# Patient Record
Sex: Female | Born: 1995 | Race: White | Hispanic: No | Marital: Single | Smoking: Never smoker
Health system: Southern US, Community
[De-identification: ages and names within clinical notes are randomized; demographics above are authoritative.]

## PROBLEM LIST (undated history)

## (undated) DIAGNOSIS — K219 Gastro-esophageal reflux disease without esophagitis: Secondary | ICD-10-CM

## (undated) DIAGNOSIS — K5900 Constipation, unspecified: Secondary | ICD-10-CM

## (undated) DIAGNOSIS — G8389 Other specified paralytic syndromes: Secondary | ICD-10-CM

## (undated) DIAGNOSIS — Q8789 Other specified congenital malformation syndromes, not elsewhere classified: Secondary | ICD-10-CM

## (undated) DIAGNOSIS — T7840XA Allergy, unspecified, initial encounter: Secondary | ICD-10-CM

## (undated) HISTORY — DX: Other specified congenital malformation syndromes, not elsewhere classified: Q87.89

## (undated) HISTORY — DX: Allergy, unspecified, initial encounter: T78.40XA

## (undated) HISTORY — DX: Gastro-esophageal reflux disease without esophagitis: K21.9

## (undated) HISTORY — DX: Constipation, unspecified: K59.00

---
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
## 2016-07-26 ENCOUNTER — Ambulatory Visit
Admission: RE | Admit: 2016-07-26 | Discharge: 2016-07-26 | Disposition: A | Discharge status: Home / Self Care | Payer: BC Managed Care – PPO | Source: Ambulatory Visit | Attending: Pediatrics | Admitting: Pediatrics

## 2016-07-26 ENCOUNTER — Other Ambulatory Visit: Payer: Self-pay | Admitting: Pediatrics

## 2016-07-26 ENCOUNTER — Ambulatory Visit: Payer: BC Managed Care – PPO | Attending: Pediatrics | Admitting: Occupational Therapy

## 2016-07-26 ENCOUNTER — Ambulatory Visit
Admission: RE | Admit: 2016-07-26 | Discharge: 2016-07-26 | Disposition: A | Discharge status: Home / Self Care | Payer: BC Managed Care – PPO | Attending: Pediatrics | Admitting: Pediatrics

## 2016-07-26 ENCOUNTER — Encounter: Payer: Self-pay | Admitting: Occupational Therapy

## 2016-07-26 DIAGNOSIS — M25572 Pain in left ankle and joints of left foot: Secondary | ICD-10-CM | POA: Insufficient documentation

## 2016-07-26 DIAGNOSIS — Z9889 Other specified postprocedural states: Secondary | ICD-10-CM | POA: Diagnosis not present

## 2016-07-26 DIAGNOSIS — R278 Other lack of coordination: Secondary | ICD-10-CM | POA: Diagnosis present

## 2016-07-26 DIAGNOSIS — M6281 Muscle weakness (generalized): Secondary | ICD-10-CM | POA: Insufficient documentation

## 2016-07-26 NOTE — Therapy (Signed)
[place] MAIN [facility] SERVICES [address] [place], [place], [address] Phone: [phone]   Fax:  [phone]  Occupational Therapy Evaluation  Patient Details  Name: JATON EILERS MRN: [id number] Date of Birth: 02/18/1996 Referring Provider: Wynetta Emery  Encounter Date: 07/26/2016      OT End of Session - 07/26/16 1703    Visit Number 1   Number of Visits 24   Date for OT Re-Evaluation 10/18/16   OT Start Time 1308   OT Stop Time 1356   OT Time Calculation (min) 48 min   Activity Tolerance Patient tolerated treatment well   Behavior During Therapy [facility] for tasks assessed/performed      Past Medical History:  Diagnosis Date  . Allergy   . Constipation   . GERD (gastroesophageal reflux disease)   . Pitt-Hopkins syndrome     History reviewed. No pertinent surgical history.  There were no vitals filed for this visit.      Subjective Assessment - 07/26/16 1405    Subjective  Pt. was present with her father, and caregiver. Pt. attends school 4 days a week.   Patient is accompained by: Family member   Pertinent History Pt. 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 Pt. attends school in a [employer] classroom with the [employer]. Pt.. resideds with her parents, and has a personal care aide 4 times a week to assist with ADLs, and IADLs.   Currently in Pain? No/denies           [facility] OT Assessment - 07/26/16 1408      Assessment   Diagnosis Pitt-Hopkins Syndrome   Referring Provider Wynetta Emery   Onset Date 1995-12-27     Precautions   Precautions None     Restrictions   Weight Bearing Restrictions No     Balance Screen   Has the patient fallen in the past 6 months No   Has the patient had a decrease in activity level because of a fear of falling?  Yes   Is the patient reluctant to leave their home because of a fear of falling?  No     Home  Environment   Family/patient expects to be discharged to: Private residence   Living  Arrangements Parent   Available Help at Discharge Personal care attendant  Mother, and father   Type of [place] Access Level entry   [place] One level   Bathroom Shower/Tub Tub/Shower unit   Copy Yes   Lives With [place], [place],  swinging, and listening to music.     ADL   Eating/Feeding Independent  Assist with cutting, and set-up. Verbal prompting/cuing.   Grooming Min guard   Upper Body Dressing Minimal assistance   Lower Body Dressing Moderate assistance;Independent   Toilet [place] Moderate assistance   Tub/Shower Transfer Min guard     IADL   Shopping Needs to be accompanied on any shopping trip;Completely unable to shop   Light Housekeeping Needs help with all home maintenance tasks   Meal Prep Needs to have meals prepared and served   Medication Management Is not capable of dispensing or managing own medication   Prior Level of Function Financial Management Unable to complete   Financial Management Is not capable of dispensing or managing own  medication     Written Expression   Dominant Hand Left   Handwriting Not legible     Vision - History   Baseline Vision Wears glasses all the time     Cognition   Overall Cognitive Status History of cognitive impairments - at baseline   Memory Impaired   Awareness Impaired   Problem Solving Impaired     Sensation   Light Touch Appears Intact     Coordination   Gross Motor Movements are Fluid and Coordinated --  Spontaneous rocking forward at trunk when seated.   Coordination and Movement Description Limited coordination, dexterity.Marland Kitchen   9 Hole Peg Test --  Not timed. Increased time to complete, constant verbal cues.     Strength   Overall Strength Comments --  Difficult to assess secondary to cognition.     Hand Function    Right Hand Grip (lbs) 12   Right Hand Lateral Pinch 3 lbs   Left Hand Grip (lbs) 10   Left Hand Lateral Pinch 5 lbs                              OT Long Term Goals - 07/26/16 1628      OT LONG TERM GOAL #1   Title Pt. will increase grip strength by 5# to assist with ADL tasks.    Baseline Right: 12#, Left: 10#   Time 12   Period Weeks   Status New     OT LONG TERM GOAL #2   Title Pt. will increase lateral grip strength by 3# to be able to assist with ADL tasks.   Baseline Right: 3#, Left: 5#   Time 12   Period Weeks   Status New     OT LONG TERM GOAL #3   Title Pt. will improve fine motor coordination skills to assist with school related tasks.   Baseline Impaired coordination skills.   Time 12   Period Weeks   Status New     OT LONG TERM GOAL #4   Title Pt. will improve BUE strength for improved engagement in IADL tasks.   Baseline Impaired UE strength   Time 12   Period Weeks   Status New     OT LONG TERM GOAL #5   Title Pt. will formulate 3 letters in her name independently with cues.   Baseline Pt. unable to perfrom during eval.   Time 12   Period Weeks   Status New               Plan - 07/26/16 1558    Clinical Impression Statement Pt. is a 21 y.o. female who has [facility] Disorder. Pt. presents with developmental delay. Pt. Is a student with the ABSS. Pt. enjoys horseback riding, pottery, and swinging while listening to music. Pt. presents with UE weakness, limited [facility] skills, and cognitive impairments requiring constant verbal cues, and prompting. Pt. was referred to OT services for coordination, and hand function skills. Pt. will benefit from skilled OT services to improve UE strength, [facility] skills, and to improve engagement in ADL, and IADL tasks.   Rehab Potential Good   Clinical Impairments Affecting Rehab Potential Positive Indicators: age, family support. Negative Indicators: multiple comorbidities   OT Frequency 2x /  week   OT Duration 12 weeks   OT Treatment/Interventions Self-care/ADL training;Therapeutic exercise;Manual Therapy;Neuromuscular education;DME and/or AE instruction;Energy conservation;Functional Mobility Training   Consulted and Agree with Plan  of Care Family member/caregiver      Patient will benefit from skilled therapeutic intervention in order to improve the following deficits and impairments:  Decreased activity tolerance, Decreased balance, Impaired UE functional use, Decreased cognition, Decreased coordination, Decreased safety awareness, Impaired perceived functional ability, Decreased strength, Decreased knowledge of use of DME, Decreased endurance, Impaired vision/preception  Visit Diagnosis: Muscle weakness (generalized)  Other lack of coordination    Problem List There are no active problems to display for this patient.   [name], MS, OTR/L 07/26/2016, 5:09 PM  [place] MAIN [facility] SERVICES [address] [place], [place], [address] Phone: [phone]   Fax:  [phone]  Name: LINAH KLAPPER MRN: [id number] Date of Birth: Apr 20, 1995

## 2016-07-30 ENCOUNTER — Ambulatory Visit: Payer: BC Managed Care – PPO | Admitting: Occupational Therapy

## 2016-08-06 ENCOUNTER — Ambulatory Visit: Payer: BC Managed Care – PPO | Admitting: Occupational Therapy

## 2016-08-06 DIAGNOSIS — M6281 Muscle weakness (generalized): Secondary | ICD-10-CM

## 2016-08-06 DIAGNOSIS — R278 Other lack of coordination: Secondary | ICD-10-CM

## 2016-08-06 NOTE — Therapy (Signed)
[place] MA[facility] SERVICES [address] [place], [place], [address] Phone: [phone]   Fax:  [phone]  Occupational Therapy Treatment  Patient Details  Name: Ariel Powell MRN: [id number] Date of Birth: 07/19/1995 Referring Provider: Wynetta Emery  Encounter Date: 08/06/2016      OT End of Session - 08/06/16 1724    Visit Number 2   Number of Visits 24   Date for OT Re-Evaluation 10/18/16   OT Start Time 1530   OT Stop Time 1615   OT Time Calculation (min) 45 min   Activity Tolerance Patient tolerated treatment well   Behavior During Therapy [facility] for tasks assessed/performed      Past Medical History:  Diagnosis Date  . Allergy   . Constipation   . GERD (gastroesophageal reflux disease)   . Pitt-Hopkins syndrome     No past surgical history on file.  There were no vitals filed for this visit.      Subjective Assessment - 08/06/16 1723    Subjective  Pt. mother was present with pt.   Patient is accompained by: Family member   Pertinent History Pt. 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 Pt. attends school in a [employer] classroom with the ABSS. Pt.. resideds with her parents, and has a personal care aide 4 times a week to assist with ADLs, and IADLs.   Currently in Pain? No/denies                      OT Treatments/Exercises (OP) - 08/06/16 1737      Fine Motor Coordination   Other Fine Motor Exercises Pt. Worked on grasping coins from a tabletop surface, placing them into a resistive container, and pushing them through the slot while isolating his 2nd digit. Pt. requires cues  For eye hand coordination, and to look for the slot.Pt. Work on incorporating bilateral hand coordination by placing the coins in the palm of her right hand using her left hand. Pt. Worked on flipping pegs using the [employer]. Pt. Requires step-by step instruction to grasp with her left hand, and flip it with her right  hand, and place it back in with her left hand. Pt. Worked on grasping 1" resistive cubes, and pressing them into place while isolating her 2nd digit     Neurological Re-education Exercises   Other Exercises 1 Pt. Worked on the [employer] for 8 min. With constant monitoring of the BUEs. Pt. Worked on changing, and alternating forward reverse position every 2 min. Rest breaks were required.  Pt. Required cues for left hand placement. Pt. Was motivated by music. Pt. Worked with gripper with a red rubber band resistance. Pt. Worked on pinch strengthening in the left hand for lateral, and 3pt. pinch using yellow, red, and one green resistive clips.Tactile and verbal cues were required for eliciting the desired movement.                OT Education - 08/06/16 1728    Education provided Yes   Education Details [place], hand strength   Person(s) Educated Patient;Parent(s)   Methods Explanation;Demonstration;Verbal cues   Comprehension Need further instruction;Returned demonstration;Verbal cues required             OT Long Term Goals - 07/26/16 1628      OT LONG TERM GOAL #1   Title Pt. will increase grip strength by 5# to assist with ADL tasks.    Baseline Right:  12#, Left: 10#   Time 12   Period Weeks   Status New     OT LONG TERM GOAL #2   Title Pt. will increase lateral grip strength by 3# to be able to assist with ADL tasks.   Baseline Right: 3#, Left: 5#   Time 12   Period Weeks   Status New     OT LONG TERM GOAL #3   Title Pt. will improve fine motor coordination skills to assist with school related tasks.   Baseline Impaired coordination skills.   Time 12   Period Weeks   Status New     OT LONG TERM GOAL #4   Title Pt. will improve BUE strength for improved engagement in IADL tasks.   Baseline Impaired UE strength   Time 12   Period Weeks   Status New     OT LONG TERM GOAL #5   Title Pt. will formulate 3 letters in her name independently with cues.   Baseline Pt.  unable to perfrom during eval.   Time 12   Period Weeks   Status New               Plan - 08/06/16 1724    Clinical Impression Statement Pt.'s mother was present during session, and reports she would like the pt. to work on improving bilateral hand coordination skills, and eye hand coordination.. Pt. requires extensive verbal cues, visual demonstration, visual, and tactile cues. Pt. requires assist, and cues for redirection.   Rehab Potential Good   Clinical Impairments Affecting Rehab Potential Positive Indicators: age, family support. Negative Indicators: multiple comorbidities   OT Treatment/Interventions Self-care/ADL training;Therapeutic exercise;Manual Therapy;Neuromuscular education;DME and/or AE instruction;Energy conservation;Functional Mobility Training   Consulted and Agree with Plan of Care Family member/caregiver      Patient will benefit from skilled therapeutic intervention in order to improve the following deficits and impairments:  Decreased activity tolerance, Decreased balance, Impaired UE functional use, Decreased cognition, Decreased coordination, Decreased safety awareness, Impaired perceived functional ability, Decreased strength, Decreased knowledge of use of DME, Decreased endurance, Impaired vision/preception  Visit Diagnosis: Muscle weakness (generalized)  Other lack of coordination    Problem List There are no active problems to display for this patient.   [name], MS, OTR/L 08/06/2016, 5:37 PM  [place] MAIN [facility] SERVICES [address] [place], [place], [address] Phone: [phone]   Fax:  [phone]  Name: [name] MRN: [id number] Date of Birth: Jun 06, 1995

## 2016-08-06 NOTE — Patient Instructions (Addendum)
OT TREATMENT    Pt. Worked on   Neuro muscular re-education:  Pt. Worked on grasping coins from a tabletop surface, placing them into a resistive container, and pushing them through the slot while isolating his 2nd digit. Pt. requires cues  For eye hand coordination, and to look for the slot.Pt. Work on incorporating bilateral hand coordination by placing the coins in the palm of her right hand using her left hand. Pt. Worked on flipping pegs using the [employer]. Pt. Requires ]step-by step instruction to grasp with her left hand, and flip it with her right hand, and place it back in with her left hand. Pt. Worked on grasping 1" resistive cubes, and pressing them into place while isolating her 2nd digit  Therapeutic Exercise:  Pt. Worked on the [employer] for 8 min. With constant monitoring of the BUEs. Pt. Worked on changing, and alternating forward reverse position every 2 min. Rest breaks were required.  Pt. Required cues for left hand placement. Pt. Was motivated by music. Pt. Worked with gripper with a red rubber band resistance. Pt. Worked on pinch strengthening in the left hand for lateral, and 3pt. pinch using yellow, red, and one green resistive clips.Tactile and verbal cues were required for eliciting the desired movement.  Selfcare:  Manual Therapy:

## 2016-08-09 ENCOUNTER — Ambulatory Visit: Payer: BC Managed Care – PPO | Admitting: Occupational Therapy

## 2016-08-13 ENCOUNTER — Ambulatory Visit: Payer: BC Managed Care – PPO | Admitting: Occupational Therapy

## 2016-08-13 DIAGNOSIS — M6281 Muscle weakness (generalized): Secondary | ICD-10-CM | POA: Diagnosis not present

## 2016-08-13 DIAGNOSIS — R278 Other lack of coordination: Secondary | ICD-10-CM

## 2016-08-13 NOTE — Therapy (Signed)
[place] MAIN [facility] SERVICES [address] [place], [place], [address] Phone: [phone]   Fax:  [phone]  Occupational Therapy Treatment  Patient Details  Name: DAZARIA MACNEILL MRN: [id number] Date of Birth: 01-20-1996 Referring Provider: Wynetta Emery  Encounter Date: 08/13/2016      OT End of Session - 08/13/16 1721    Visit Number 3   Number of Visits 24   Date for OT Re-Evaluation 10/18/16   OT Start Time 5035   OT Stop Time 1615   OT Time Calculation (min) 45 min   Activity Tolerance Patient tolerated treatment well   Behavior During Therapy [facility] for tasks assessed/performed      Past Medical History:  Diagnosis Date  . Allergy   . Constipation   . GERD (gastroesophageal reflux disease)   . Pitt-Hopkins syndrome     No past surgical history on file.  There were no vitals filed for this visit.      Subjective Assessment - 08/13/16 1719    Subjective  Pt. personal aide brought pt. to therapy. Pt. was in therapy by herself today.   Patient is accompained by: Family member   Pertinent History Pt. 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 Pt. attends school in a [employer] classroom with the ABSS. Pt.. resideds with her parents, and has a personal care aide 4 times a week to assist with ADLs, and IADLs.   Currently in Pain? No/denies                      OT Treatments/Exercises (OP) - 08/13/16 0001      Fine Motor Coordination   Other Fine Motor Exercises Pt. performed [place] tasks using the Grooved pegboard. Pt. worked on grasping the grooved pegs from a horizontal position, and moving the pegs to a vertical position in the hand to prepare for placing them in the grooved slot. Pt. Grasps with the right hand, and used her right hand to turn the objects. Pt. Worked on grasping 1" resistive cubes, and replace them by pressing them into place while isolating her 2nd digit. Pt. Worked on manipulating screw on a bolt  tower.      Neurological Re-education Exercises   Other Exercises 1 Pt. Worked on the [employer] for 5 min. With constant monitoring of the BUEs. Pt. Worked on changing, and alternating forward reverse position every 2 min. Rest breaks were required. Pt. Requires cues, and encouragement. Pt. Is very motivated by music. Pt. performed gross gripping with grip strengthener. Pt. worked on sustaining grip while grasping pegs and reaching at various heights. Gripper was placed in the 2nd resistive slot with the white resistive spring. Occassional Hand over hand assist was needed.                 OT Education - 08/13/16 1721    Education provided Yes   Education Details [place], hand strength   Person(s) Educated Patient;Parent(s)   Methods Explanation;Demonstration;Verbal cues   Comprehension Need further instruction;Returned demonstration;Verbal cues required             OT Long Term Goals - 07/26/16 1628      OT LONG TERM GOAL #1   Title Pt. will increase grip strength by 5# to assist with ADL tasks.    Baseline Right: 12#, Left: 10#   Time 12   Period Weeks   Status New     OT LONG TERM GOAL #2  Title Pt. will increase lateral grip strength by 3# to be able to assist with ADL tasks.   Baseline Right: 3#, Left: 5#   Time 12   Period Weeks   Status New     OT LONG TERM GOAL #3   Title Pt. will improve fine motor coordination skills to assist with school related tasks.   Baseline Impaired coordination skills.   Time 12   Period Weeks   Status New     OT LONG TERM GOAL #4   Title Pt. will improve BUE strength for improved engagement in IADL tasks.   Baseline Impaired UE strength   Time 12   Period Weeks   Status New     OT LONG TERM GOAL #5   Title Pt. will formulate 3 letters in her name independently with cues.   Baseline Pt. unable to perfrom during eval.   Time 12   Period Weeks   Status New               Plan - 08/13/16 1722    Clinical Impression  Statement Pt. attended the session by herself today. Pt. personal aide was in the reception area. Pt. was able to attend to the full session, and required cues  for movement patterns, and redirection. Pt. conitnues to work on improving bilateral UE and hand coordination skills. Pt. requires cues to engeage the right hand.   Rehab Potential Good   Clinical Impairments Affecting Rehab Potential Positive Indicators: age, family support. Negative Indicators: multiple comorbidities   OT Frequency 2x / week   OT Duration 12 weeks   OT Treatment/Interventions Self-care/ADL training;Therapeutic exercise;Manual Therapy;Neuromuscular education;DME and/or AE instruction;Energy conservation;Functional Mobility Training   Consulted and Agree with Plan of Care Family member/caregiver      Patient will benefit from skilled therapeutic intervention in order to improve the following deficits and impairments:  Decreased activity tolerance, Decreased balance, Impaired UE functional use, Decreased cognition, Decreased coordination, Decreased safety awareness, Impaired perceived functional ability, Decreased strength, Decreased knowledge of use of DME, Decreased endurance, Impaired vision/preception  Visit Diagnosis: Muscle weakness (generalized)  Other lack of coordination    Problem List There are no active problems to display for this patient.   [name], MS, OTR/L 08/13/2016, 5:33 PM  [place] MAIN [facility] SERVICES [address] [place], [place], [address] Phone: [phone]   Fax:  [phone]  Name: SURAIYA DICKERSON MRN: [id number] Date of Birth: [birth year]

## 2016-08-13 NOTE — Patient Instructions (Signed)
OT TREATMENT     Neuro muscular re-education:  Pt. performed [facility] tasks using the Grooved pegboard. Pt. worked on grasping the grooved pegs from a horizontal position, and moving the pegs to a vertical position in the hand to prepare for placing them in the grooved slot. Pt. Grasps with the right hand, and used her right hand to turn the objects. Pt. Worked on grasping 1" resistive cubes, and replace them by pressing them into place while isolating her 2nd digit. Pt. Worked on manipulating screw on a bolt tower.   Therapeutic Exercise:  Pt. Worked on the [employer] for 5 min. With constant monitoring of the BUEs. Pt. Worked on changing, and alternating forward reverse position every 2 min. Rest breaks were required. Pt. Requires cues, and encouragement. Pt. Is very motivated by music. Pt. performed gross gripping with grip strengthener. Pt. worked on sustaining grip while grasping pegs and reaching at various heights. Gripper was placed in the 2nd resistive slot with the white resistive spring. Occassional Hand over hand assist was needed.   Selfcare:  Manual Therapy:

## 2016-08-16 ENCOUNTER — Ambulatory Visit: Payer: BC Managed Care – PPO | Attending: Pediatrics | Admitting: Occupational Therapy

## 2016-08-16 ENCOUNTER — Encounter: Payer: Self-pay | Admitting: Occupational Therapy

## 2016-08-16 DIAGNOSIS — M6281 Muscle weakness (generalized): Secondary | ICD-10-CM | POA: Insufficient documentation

## 2016-08-16 DIAGNOSIS — R278 Other lack of coordination: Secondary | ICD-10-CM

## 2016-08-16 NOTE — Patient Instructions (Signed)
OT TREATMENT     Neuro muscular re-education:  Pt. Worked on grasping 1" resistive cubes from a vertical angle. Pt. Worked on isolating her 2nd digit to press them back into place. Pt. Worked with her right, and left hands requiring increased cues.  Therapeutic Exercise:  Pt. Worked on the [employer] for 5 min. With constant monitoring of the BUEs. Pt. Requires cues and assist for hand position. Pt. performed gross gripping with grip strengthener. Pt. Required hand over hand assist for proper position. Gripper was placed in the 1st resistive slot with the white resistive spring. Pt. worked on pinch strengthening in the left hand for lateral, and 3pt. pinch using yellow, red, and green resistive clips. Pt. worked on placing the clips at various vertical and horizontal angles. Tactile and verbal cues were required for eliciting the desired movement.  Selfcare:  Manual Therapy:

## 2016-08-16 NOTE — Therapy (Signed)
[place] MAIN [facility] SERVICES [address] [place], [place], [address] Phone: [phone]   Fax:  [phone]  Occupational Therapy Treatment  Patient Details  Name: Ariel Powell MRN: [id number] Date of Birth: 1995/11/18 Referring Provider: Wynetta Emery  Encounter Date: 08/16/2016      OT End of Session - 08/16/16 1841    Visit Number 4   Number of Visits 24   Date for OT Re-Evaluation 10/18/16   OT Start Time 5701   OT Stop Time 1615   OT Time Calculation (min) 45 min   Activity Tolerance Patient tolerated treatment well   Behavior During Therapy [facility] for tasks assessed/performed      Past Medical History:  Diagnosis Date  . Allergy   . Constipation   . GERD (gastroesophageal reflux disease)   . Pitt-Hopkins syndrome     History reviewed. No pertinent surgical history.  There were no vitals filed for this visit.      Subjective Assessment - 08/16/16 1840    Subjective  Pt.'s personal adie attended the treat session with pt.   Patient is accompained by: Family member   Pertinent History Pt. 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 Pt. attends school in a [employer] classroom with the [employer]SS. Pt.. resideds with her parents, and has a personal care aide 4 times a week to assist with ADLs, and IADLs.   Currently in Pain? No/denies                      OT Treatments/Exercises (OP) - 08/16/16 0001      Neurological Re-education Exercises   Other Exercises 1 Pt. Worked on the [employer] for 5 min. With constant monitoring of the BUEs. Pt. Requires cues and assist for hand position.   Other Exercises 2 Pt. Worked on grasping 1" resistive cubes from a vertical angle. Pt. Worked on isolating her 2nd digit to press them back into place. Pt. Worked with her right, and left hands requiring increased cues.                OT Education - 08/16/16 1841    Education provided Yes   Education Details right hand function    Person(s) Educated Patient   Methods Demonstration;Explanation;Verbal cues   Comprehension Returned demonstration;Need further instruction;Tactile cues required             OT Long Term Goals - 07/26/16 1628      OT LONG TERM GOAL #1   Title Pt. will increase grip strength by 5# to assist with ADL tasks.    Baseline Right: 12#, Left: 10#   Time 12   Period Weeks   Status New     OT LONG TERM GOAL #2   Title Pt. will increase lateral grip strength by 3# to be able to assist with ADL tasks.   Baseline Right: 3#, Left: 5#   Time 12   Period Weeks   Status New     OT LONG TERM GOAL #3   Title Pt. will improve fine motor coordination skills to assist with school related tasks.   Baseline Impaired coordination skills.   Time 12   Period Weeks   Status New     OT LONG TERM GOAL #4   Title Pt. will improve BUE strength for improved engagement in IADL tasks.   Baseline Impaired UE strength   Time 12   Period Weeks   Status New  OT LONG TERM GOAL #5   Title Pt. will formulate 3 letters in her name independently with cues.   Baseline Pt. unable to perfrom during eval.   Time 12   Period Weeks   Status New               Plan - 08/16/16 1841    Clinical Impression Statement Pt. is very motivated by music. Today is the Pt.'s birthday. Pt. brought her communication board with her to therapy. Pt. conitnues to work on improving [facility] skills, and improving engagement of her right hand into ADL tasks. Pt. requires consistent cues, and assist to use her right hand.    Rehab Potential Good   Clinical Impairments Affecting Rehab Potential Positive Indicators: age, family support. Negative Indicators: multiple comorbidities   OT Frequency 2x / week   OT Duration 12 weeks   OT Treatment/Interventions Self-care/ADL training;Therapeutic exercise;Manual Therapy;Neuromuscular education;DME and/or AE instruction;Energy conservation;Functional Mobility Training   Consulted and  Agree with Plan of Care Family member/caregiver   Family Member Consulted Personal care aide      Patient will benefit from skilled therapeutic intervention in order to improve the following deficits and impairments:  Decreased activity tolerance, Decreased balance, Impaired UE functional use, Decreased cognition, Decreased coordination, Decreased safety awareness, Impaired perceived functional ability, Decreased strength, Decreased knowledge of use of DME, Decreased endurance, Impaired vision/preception  Visit Diagnosis: Muscle weakness (generalized)  Other lack of coordination    Problem List There are no active problems to display for this patient.   [name], MS, OTR/L 08/16/2016, 6:50 PM  [place] MAIN [facility] SERVICES [address] [place], [place], [address] Phone: [phone]   Fax:  [phone]  Name: Ariel Powell MRN: [id number] Date of Birth: 01-26-1996

## 2016-08-20 ENCOUNTER — Ambulatory Visit: Payer: BC Managed Care – PPO | Admitting: Occupational Therapy

## 2016-08-23 ENCOUNTER — Ambulatory Visit: Payer: BC Managed Care – PPO | Attending: Pediatrics | Admitting: Occupational Therapy

## 2016-08-23 DIAGNOSIS — R278 Other lack of coordination: Secondary | ICD-10-CM | POA: Insufficient documentation

## 2016-08-23 DIAGNOSIS — M6281 Muscle weakness (generalized): Secondary | ICD-10-CM | POA: Diagnosis not present

## 2016-08-24 NOTE — Therapy (Signed)
[place] MAIN [facility] SERVICES [address] [place], [place], [address] Phone: [phone]   Fax:  [phone]  Occupational Therapy Treatment  Patient Details  Name: Ariel Powell MRN: [id number] Date of Birth: 01-Sep-1995 Referring Provider: Wynetta Emery  Encounter Date: 08/23/2016      OT End of Session - 08/24/16 0853    Visit Number 5   Number of Visits 24   Date for OT Re-Evaluation 10/18/16   OT Start Time 9373   OT Stop Time 1615   OT Time Calculation (min) 45 min   Activity Tolerance Patient tolerated treatment well   Behavior During Therapy [facility] for tasks assessed/performed      Past Medical History:  Diagnosis Date  . Allergy   . Constipation   . GERD (gastroesophageal reflux disease)   . Pitt-Hopkins syndrome     No past surgical history on file.  There were no vitals filed for this visit.      Subjective Assessment - 08/24/16 0851    Subjective  Pt.'s aide reports pt. is feeling better, and had allergies.   Patient is accompained by: Family member   Pertinent History Pt. 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 Pt. attends school in a [employer] classroom with the ABSS. Pt.. resideds with her parents, and has a personal care aide 4 times a week to assist with ADLs, and IADLs.   Currently in Pain? No/denies     OT TREATMENT     Neuro muscular re-education:  Pt. Worked on throwing an releasing 4" balls through a target. Pt. required cues to use her right hand, as well as using her right, and left hands together. Pt. Worked on grasping [employer] sticks with her right hand, and placing them vertically in the designated holes. Pt. Worked on flipping, and sorting cards with her right hand  Therapeutic Exercise:  Pt. Worked on the [employer] for 8 min. With constant monitoring of the BUEs. Pt. Worked on changing, and alternating forward reverse position every 2 min. Rest breaks were required. Pt. Is very motivated by  [employer].  :                             OT Education - 08/24/16 [phone]    Education provided Yes   Education Details Bilateral hand coordination, with cues to incorporate right hand   Person(s) Educated Patient   Methods Explanation;Demonstration;Verbal cues   Comprehension Verbalized understanding;Need further instruction;Tactile cues required             OT Long Term Goals - 07/26/16 1628      OT LONG TERM GOAL #1   Title Pt. will increase grip strength by 5# to assist with ADL tasks.    Baseline Right: 12#, Left: 10#   Time 12   Period Weeks   Status New     OT LONG TERM GOAL #2   Title Pt. will increase lateral grip strength by 3# to be able to assist with ADL tasks.   Baseline Right: 3#, Left: 5#   Time 12   Period Weeks   Status New     OT LONG TERM GOAL #3   Title Pt. will improve fine motor coordination skills to assist with school related tasks.   Baseline Impaired coordination skills.   Time 12   Period Weeks   Status New     OT LONG TERM GOAL #4  Title Pt. will improve BUE strength for improved engagement in IADL tasks.   Baseline Impaired UE strength   Time 12   Period Weeks   Status New     OT LONG TERM GOAL #5   Title Pt. will formulate 3 letters in her name independently with cues.   Baseline Pt. unable to perfrom during eval.   Time 12   Period Weeks   Status New               Plan - 08/24/16 5631    Clinical Impression Statement Pt. continues to be very motivated by music. Pt. brings in her own music, and is able to select song tracks. Pt. is using a communication app on her IPad. Pt. conitnues to require cues to use her right hand during tasks. Pt. required less cuing, and less redirection today.   Rehab Potential Good   Clinical Impairments Affecting Rehab Potential Positive Indicators: age, family support. Negative Indicators: multiple comorbidities   OT Frequency 2x / week   OT Duration 12 weeks   OT  Treatment/Interventions Self-care/ADL training;Therapeutic exercise;Manual Therapy;Neuromuscular education;DME and/or AE instruction;Energy conservation;Functional Mobility Training   Consulted and Agree with Plan of Care Family member/caregiver   Family Member Consulted Personal care aide      Patient will benefit from skilled therapeutic intervention in order to improve the following deficits and impairments:  Decreased activity tolerance, Decreased balance, Impaired UE functional use, Decreased cognition, Decreased coordination, Decreased safety awareness, Impaired perceived functional ability, Decreased strength, Decreased knowledge of use of DME, Decreased endurance, Impaired vision/preception  Visit Diagnosis: Muscle weakness (generalized)  Other lack of coordination    Problem List There are no active problems to display for this patient.   [name], MS, OTR/L 08/24/2016, 8:57 AM  [place] MAIN [facility] SERVICES [address] [place], [place], [address] Phone: [phone]   Fax:  [phone]  Name: Ariel Powell MRN: [id number] Date of Birth: 1995/07/05

## 2016-08-27 ENCOUNTER — Ambulatory Visit: Payer: BC Managed Care – PPO | Attending: Pediatrics | Admitting: Occupational Therapy

## 2016-08-27 DIAGNOSIS — R278 Other lack of coordination: Secondary | ICD-10-CM | POA: Insufficient documentation

## 2016-08-27 DIAGNOSIS — M6281 Muscle weakness (generalized): Secondary | ICD-10-CM | POA: Diagnosis not present

## 2016-08-28 NOTE — Therapy (Signed)
[place] MAIN [facility] SERVICES [address] [place], [place], [address] Phone: [phone]   Fax:  [phone]  Occupational Therapy Treatment  Patient Details  Name: Ariel Powell MRN: [id number] Date of Birth: Jun 10, 1995 Referring Provider: Wynetta Emery  Encounter Date: 08/27/2016      OT End of Session - 08/28/16 1025    Visit Number 6   Number of Visits 24   Date for OT Re-Evaluation 10/18/16   OT Start Time 1530   OT Stop Time 1615   OT Time Calculation (min) 45 min   Activity Tolerance Patient tolerated treatment well   Behavior During Therapy [facility] for tasks assessed/performed      Past Medical History:  Diagnosis Date  . Allergy   . Constipation   . GERD (gastroesophageal reflux disease)   . Pitt-Hopkins syndrome     No past surgical history on file.  There were no vitals filed for this visit.      Subjective Assessment - 08/28/16 1025    Subjective  Pt. attended session while personal care aide waited in the waiting room.   Patient is accompained by: Family member   Pertinent History Pt. 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 Pt. attends school in a [employer] classroom with the ABSS. Pt.. resideds with her parents, and has a personal care aide 4 times a week to assist with ADLs, and IADLs.   Currently in Pain? No/denies        OT TREATMENT    Neuro muscular re-education:  Pt. worked on motor control, and coordination skills needed to grasp 1/2" washers and place them on a vertical dowel tower, with dowels positioned at multiple angles. Pt. worked on flipping, and sorting cards with her right hand while holding the deck with her left hand.  Therapeutic Exercise:  Pt. worked on pinch strengthening in the left hand for lateral, and 3pt. pinch using yellow, red, green, and blue resistive clips. Pt. worked on placing the clips at various vertical and horizontal angles. Tactile and verbal cues were required for  eliciting the desired movement.  Selfcare:                                  OT Long Term Goals - 07/26/16 1628      OT LONG TERM GOAL #1   Title Pt. will increase grip strength by 5# to assist with ADL tasks.    Baseline Right: 12#, Left: 10#   Time 12   Period Weeks   Status New     OT LONG TERM GOAL #2   Title Pt. will increase lateral grip strength by 3# to be able to assist with ADL tasks.   Baseline Right: 3#, Left: 5#   Time 12   Period Weeks   Status New     OT LONG TERM GOAL #3   Title Pt. will improve fine motor coordination skills to assist with school related tasks.   Baseline Impaired coordination skills.   Time 12   Period Weeks   Status New     OT LONG TERM GOAL #4   Title Pt. will improve BUE strength for improved engagement in IADL tasks.   Baseline Impaired UE strength   Time 12   Period Weeks   Status New     OT LONG TERM GOAL #5   Title Pt. will formulate 3 letters in  her name independently with cues.   Baseline Pt. unable to perfrom during eval.   Time 12   Period Weeks   Status New               Plan - 08/28/16 1025    Clinical Impression Statement Pt. is nonverbal. Pt. uses gestures to communicate, and uses a communication board. Pt. is motivated by music, and often has her own music.  Pt. requires verbal, and tactile cues to incorporate her right hand into each task. Pt. often requires cues for redirection.   Rehab Potential Good   Clinical Impairments Affecting Rehab Potential Positive Indicators: age, family support. Negative Indicators: multiple comorbidities   OT Frequency 2x / week   OT Duration 12 weeks   OT Treatment/Interventions Self-care/ADL training;Therapeutic exercise;Manual Therapy;Neuromuscular education;DME and/or AE instruction;Energy conservation;Functional Mobility Training   Consulted and Agree with Plan of Care Family member/caregiver      Patient will benefit from skilled therapeutic  intervention in order to improve the following deficits and impairments:  Decreased activity tolerance, Decreased balance, Impaired UE functional use, Decreased cognition, Decreased coordination, Decreased safety awareness, Impaired perceived functional ability, Decreased strength, Decreased knowledge of use of DME, Decreased endurance, Impaired vision/preception  Visit Diagnosis: Muscle weakness (generalized)  Other lack of coordination    Problem List There are no active problems to display for this patient.   [name], MS, OTR/L 08/28/2016, 1:39 PM  [place] MAIN [facility] SERVICES [address] [place], [place], [address] Phone: [phone]   Fax:  [phone]  Name: Ariel Powell MRN: [id number] Date of Birth: 08/27/1995

## 2016-08-30 ENCOUNTER — Ambulatory Visit: Payer: BC Managed Care – PPO | Admitting: Occupational Therapy

## 2016-08-30 ENCOUNTER — Encounter: Payer: Self-pay | Admitting: Occupational Therapy

## 2016-08-30 DIAGNOSIS — R278 Other lack of coordination: Secondary | ICD-10-CM

## 2016-08-30 DIAGNOSIS — M6281 Muscle weakness (generalized): Secondary | ICD-10-CM

## 2016-08-30 NOTE — Therapy (Signed)
[place] MAIN [facility] SERVICES [address] [place], [place], [address] Phone: [phone]   Fax:  [phone]  Occupational Therapy Treatment  Patient Details  Name: Ariel Powell MRN: [id number] Date of Birth: 12-06-1995 Referring Provider: Wynetta Emery  Encounter Date: 08/30/2016      OT End of Session - 08/30/16 1624    Visit Number 7   Number of Visits 24   Date for OT Re-Evaluation 10/18/16   OT Start Time 1530   OT Stop Time 1613   OT Time Calculation (min) 43 min   Activity Tolerance Patient tolerated treatment well   Behavior During Therapy [facility] for tasks assessed/performed      Past Medical History:  Diagnosis Date  . Allergy   . Constipation   . GERD (gastroesophageal reflux disease)   . Pitt-Hopkins syndrome     History reviewed. No pertinent surgical history.  There were no vitals filed for this visit.      Subjective Assessment - 08/30/16 1621    Subjective  Pt. and personal care aide were present for treatment session.   Patient is accompained by: Family member   Pertinent History Pt. 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 Pt. attends school in a [employer] classroom with the ABSS. Pt.. resideds with her parents, and has a personal care aide 4 times a week to assist with ADLs, and IADLs.   Currently in Pain? No/denies     OT TREATMENT    Neuro muscular re-education:  Pt. worked on motor control, and coordination skills needed to grasp 1/2" washers and place them on a vertical dowel tower, with dowels positioned at multiple angles. Pt. Required cues for bilateral hand use. Pt. worked on grasping coins from a tabletop surface, placing them into a resistive container, and pushing them through the slot while isolating his 2nd digit.   Therapeutic Exercise:  Pt. Worked on the [employer] for 8 min. With constant monitoring, and cues to keep  BUEs on the handles. Pt. worked on changing, and alternating forward  reverse position. Rest breaks were required. Pt. performed gross gripping with grip strengthener. Pt. worked on sustaining grip while grasping pegs and reaching at various heights. Gripper was placed in the 1st resistive slot with the white resistive spring. Pt. required cues for right hand function.                           OT Education - 08/30/16 1623    Education provided Yes   Education Details Bilateral hand coordination skills.   Person(s) Educated Patient   Methods Explanation;Demonstration;Verbal cues   Comprehension Verbalized understanding;Need further instruction;Tactile cues required             OT Long Term Goals - 07/26/16 1628      OT LONG TERM GOAL #1   Title Pt. will increase grip strength by 5# to assist with ADL tasks.    Baseline Right: 12#, Left: 10#   Time 12   Period Weeks   Status New     OT LONG TERM GOAL #2   Title Pt. will increase lateral grip strength by 3# to be able to assist with ADL tasks.   Baseline Right: 3#, Left: 5#   Time 12   Period Weeks   Status New     OT LONG TERM GOAL #3   Title Pt. will improve fine motor coordination skills to assist with school  related tasks.   Baseline Impaired coordination skills.   Time 12   Period Weeks   Status New     OT LONG TERM GOAL #4   Title Pt. will improve BUE strength for improved engagement in IADL tasks.   Baseline Impaired UE strength   Time 12   Period Weeks   Status New     OT LONG TERM GOAL #5   Title Pt. will formulate 3 letters in her name independently with cues.   Baseline Pt. unable to perfrom during eval.   Time 12   Period Weeks   Status New               Plan - 08/30/16 1625    Clinical Impression Statement Pt. attempted several vocalizations during the session. Pt. likes the [employer] UE bike. Pt. continues to work on improving UE strength, and bilateral hand coordination skills. Pt. is initiating more with right hand, and required less cues  today.   Rehab Potential Good   Clinical Impairments Affecting Rehab Potential Positive Indicators: age, family support. Negative Indicators: multiple comorbidities   OT Frequency 2x / week   OT Duration 12 weeks   OT Treatment/Interventions Self-care/ADL training;Therapeutic exercise;Manual Therapy;Neuromuscular education;DME and/or AE instruction;Energy conservation;Functional Mobility Training   Consulted and Agree with Plan of Care Family member/caregiver   Family Member Consulted Personal care aide      Patient will benefit from skilled therapeutic intervention in order to improve the following deficits and impairments:  Decreased activity tolerance, Decreased balance, Impaired UE functional use, Decreased cognition, Decreased coordination, Decreased safety awareness, Impaired perceived functional ability, Decreased strength, Decreased knowledge of use of DME, Decreased endurance, Impaired vision/preception  Visit Diagnosis: Muscle weakness (generalized)  Other lack of coordination    Problem List There are no active problems to display for this patient.   [name], MS, OTR/L 08/30/2016, 4:33 PM  [place] MAIN [facility] SERVICES [address] [place], [place], [address] Phone: [phone]   Fax:  [phone]  Name: Ariel Powell MRN: [id number] Date of Birth: 08-Nov-1995

## 2016-09-06 ENCOUNTER — Ambulatory Visit: Payer: BC Managed Care – PPO | Admitting: Occupational Therapy

## 2016-09-06 DIAGNOSIS — M6281 Muscle weakness (generalized): Secondary | ICD-10-CM | POA: Diagnosis not present

## 2016-09-06 NOTE — Therapy (Signed)
[place] MAIN [facility] SERVICES [address] [place], [place], [address] Phone: [phone]   Fax:  [phone]  Occupational Therapy Treatment  Patient Details  Name: Ariel Powell MRN: [id number] Date of Birth: 1995/12/23 Referring Provider: Wynetta Emery  Encounter Date: 09/06/2016      OT End of Session - 09/06/16 1718    Visit Number 8   Number of Visits 24   Date for OT Re-Evaluation 10/18/16   OT Start Time 5277   OT Stop Time 1615   OT Time Calculation (min) 45 min   Activity Tolerance Patient tolerated treatment well   Behavior During Therapy [facility] for tasks assessed/performed      Past Medical History:  Diagnosis Date  . Allergy   . Constipation   . GERD (gastroesophageal reflux disease)   . Pitt-Hopkins syndrome     No past surgical history on file.  There were no vitals filed for this visit.      Subjective Assessment - 09/06/16 1717    Patient is accompained by: Family member   Pertinent History Pt. is a 21 y.o. female who has Pitt Hopkins Syndrome. Pt. attends school in a [employer] classroom with the ABSS. Pt.. resideds with her parents, and has a personal care aide 4 times a week to assist with ADLs, and IADLs.   Currently in Pain? No/denies        OT TREATMENT    Neuro muscular re-education:  Pt. performed resistive EZ Board exercises grasping 2" rectangle blocks. Pt. Worked on grasping 1/2" pegs and placing them in a pegboard. Pt. Requires verbal cues, tactile cues, and visual demonstration.  Therapeutic Exercise:  Pt. worked on the [employer] for 8 min. with constant monitoring of the BUEs. Pt. worked on changing, and alternating forward reverse position every 2 min. Rest breaks were required. Pt. performed gross gripping with grip strengthener. Pt. worked on sustaining grip while grasping pegs and reaching at various heights. Gripper was placed in the 1st resistive slot with the white resistive spring. Pt. required  support on the gripper for the horizontal position.                          OT Education - 09/06/16 1718    Education provided Yes   Education Details bilateral hand coordination   Person(s) Educated Patient   Methods Explanation;Demonstration;Verbal cues   Comprehension Verbalized understanding;Need further instruction;Tactile cues required             OT Long Term Goals - 07/26/16 1628      OT LONG TERM GOAL #1   Title Pt. will increase grip strength by 5# to assist with ADL tasks.    Baseline Right: 12#, Left: 10#   Time 12   Period Weeks   Status New     OT LONG TERM GOAL #2   Title Pt. will increase lateral grip strength by 3# to be able to assist with ADL tasks.   Baseline Right: 3#, Left: 5#   Time 12   Period Weeks   Status New     OT LONG TERM GOAL #3   Title Pt. will improve fine motor coordination skills to assist with school related tasks.   Baseline Impaired coordination skills.   Time 12   Period Weeks   Status New     OT LONG TERM GOAL #4   Title Pt. will improve BUE strength for improved engagement in IADL tasks.  Baseline Impaired UE strength   Time 12   Period Weeks   Status New     OT LONG TERM GOAL #5   Title Pt. will formulate 3 letters in her name independently with cues.   Baseline Pt. unable to perfrom during eval.   Time 12   Period Weeks   Status New               Plan - 09/06/16 1721    Clinical Impression Statement Pt. continues to work improving BUE strength, motor control and coordination skills. Pt. is focusing on imrpoving bilateral hand coordination to improve engagement of bilateral hands during ADL, and IADL tasks. Pt. requires cues to incorporate her right hand into daily tasks.   Rehab Potential Good   OT Frequency 2x / week   OT Duration 12 weeks   OT Treatment/Interventions Self-care/ADL training;Therapeutic exercise;Manual Therapy;Neuromuscular education;DME and/or AE instruction;Energy  conservation;Functional Mobility Training   Consulted and Agree with Plan of Care Family member/caregiver      Patient will benefit from skilled therapeutic intervention in order to improve the following deficits and impairments:  Decreased activity tolerance, Decreased balance, Impaired UE functional use, Decreased cognition, Decreased coordination, Decreased safety awareness, Impaired perceived functional ability, Decreased strength, Decreased knowledge of use of DME, Decreased endurance, Impaired vision/preception  Visit Diagnosis: Muscle weakness (generalized)    Problem List There are no active problems to display for this patient.   [name], MS, OTR/L 09/06/2016, 5:26 PM  [place] MAIN [facility] SERVICES [address] [place], [place], [address] Phone: [phone]   Fax:  [phone]  Name: Ariel Powell MRN: [id number] Date of Birth: 11/25/1995

## 2016-09-11 ENCOUNTER — Ambulatory Visit: Payer: BC Managed Care – PPO | Admitting: Occupational Therapy

## 2016-09-18 ENCOUNTER — Encounter: Payer: BC Managed Care – PPO | Admitting: Occupational Therapy

## 2016-09-20 ENCOUNTER — Ambulatory Visit: Payer: BC Managed Care – PPO | Attending: Pediatrics | Admitting: Occupational Therapy

## 2016-09-20 DIAGNOSIS — M6281 Muscle weakness (generalized): Secondary | ICD-10-CM | POA: Insufficient documentation

## 2016-09-20 DIAGNOSIS — R278 Other lack of coordination: Secondary | ICD-10-CM | POA: Diagnosis present

## 2016-09-21 NOTE — Therapy (Signed)
[place] MAIN [facility] SERVICES [address] [place], [place], [address] Phone: [phone]   Fax:  [phone]  Occupational Therapy Treatment  Patient Details  Name: Ariel Powell MRN: [id number] Date of Birth: 12-04-1995 Referring Provider: Wynetta Emery  Encounter Date: 09/20/2016      OT End of Session - 09/21/16 0845    Visit Number 9   Number of Visits 24   Date for OT Re-Evaluation 10/18/16   OT Start Time 1345   OT Stop Time 1430   OT Time Calculation (min) 45 min   Activity Tolerance Patient tolerated treatment well   Behavior During Therapy [facility] for tasks assessed/performed      Past Medical History:  Diagnosis Date  . Allergy   . Constipation   . GERD (gastroesophageal reflux disease)   . Pitt-Hopkins syndrome     No past surgical history on file.  There were no vitals filed for this visit.      Subjective Assessment - 09/21/16 0843    Subjective  Pt. personal care aide was present during the treatment session.   Patient is accompained by: Family member   Pertinent History Pt. 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 Pt. attends school in a [employer] classroom with the ABSS. Pt.. resideds with her parents, and has a personal care aide 4 times a week to assist with ADLs, and IADLs.   Currently in Pain? No/denies      OT TREATMENT    Neuro muscular re-education:  Pt. Worked on grasping, and manipulating mini clips. Pt. Requires visual demonstration, and extensive cues for the movement patterns.  Therapeutic Exercise:  Pt. Worked on the [employer] for 8 min. With constant monitoring of the BUEs. Pt. Worked on changing, and alternating forward reverse position every 2 min. Rest breaks were required. Pt. Requires verbal cues, assist, and music for motivation.                          OT Education - 09/21/16 0844    Education provided Yes   Education Details [place], bilateral hand coordination   Person(s) Educated Patient   Methods Explanation;Demonstration;Verbal cues   Comprehension Verbalized understanding;Need further instruction;Tactile cues required             OT Long Term Goals - 07/26/16 1628      OT LONG TERM GOAL #1   Title Pt. will increase grip strength by 5# to assist with ADL tasks.    Baseline Right: 12#, Left: 10#   Time 12   Period Weeks   Status New     OT LONG TERM GOAL #2   Title Pt. will increase lateral grip strength by 3# to be able to assist with ADL tasks.   Baseline Right: 3#, Left: 5#   Time 12   Period Weeks   Status New     OT LONG TERM GOAL #3   Title Pt. will improve fine motor coordination skills to assist with school related tasks.   Baseline Impaired coordination skills.   Time 12   Period Weeks   Status New     OT LONG TERM GOAL #4   Title Pt. will improve BUE strength for improved engagement in IADL tasks.   Baseline Impaired UE strength   Time 12   Period Weeks   Status New     OT LONG TERM GOAL #5   Title Pt. will formulate 3  letters in her name independently with cues.   Baseline Pt. unable to perfrom during eval.   Time 12   Period Weeks   Status New               Plan - 09/21/16 0845    Clinical Impression Statement Pt. session was shortened today as pt. had intestinal issues. Pt. caregiver present and helped to assist pt. in the bathroom. Pt. continues to work on improving biltaeral hand function, and bilateral hand coordination incorporating the RUE into more tasks.   Occupational performance deficits (Please refer to evaluation for details): ADL's   Rehab Potential Good   Current Impairments/barriers affecting progress: Positive Indicators: age, family support. Negative Indicators: multiple comorbidities   OT Frequency 2x / week   OT Duration 12 weeks   OT Treatment/Interventions Self-care/ADL training;Therapeutic exercise;Manual Therapy;Neuromuscular education;DME and/or AE instruction;Energy  conservation;Functional Mobility Training   Consulted and Agree with Plan of Care Family member/caregiver   Family Member Consulted Personal care aide      Patient will benefit from skilled therapeutic intervention in order to improve the following deficits and impairments:  Decreased activity tolerance, Decreased balance, Impaired UE functional use, Decreased cognition, Decreased coordination, Decreased safety awareness, Impaired perceived functional ability, Decreased strength, Decreased knowledge of use of DME, Decreased endurance, Impaired vision/preception  Visit Diagnosis: Muscle weakness (generalized)  Other lack of coordination    Problem List There are no active problems to display for this patient.   [name], MS, OTR/L 09/21/2016, 8:51 AM  [place] MAIN [facility] SERVICES [address] [place], [place], [address] Phone: [phone]   Fax:  [phone]  Name: Ariel Powell MRN: [id number] Date of Birth: 1995/10/23

## 2016-09-24 ENCOUNTER — Ambulatory Visit: Payer: BC Managed Care – PPO | Admitting: Occupational Therapy

## 2016-09-24 DIAGNOSIS — M6281 Muscle weakness (generalized): Secondary | ICD-10-CM | POA: Diagnosis not present

## 2016-09-24 DIAGNOSIS — R278 Other lack of coordination: Secondary | ICD-10-CM

## 2016-09-24 NOTE — Therapy (Signed)
[place] MAIN [facility] SERVICES [address] [place], [place], [address] Phone: [phone]   Fax:  [phone]  Occupational Therapy Treatment  Patient Details  Name: Ariel Powell MRN: [id number] Date of Birth: 1995/06/07 Referring Provider: Wynetta Emery  Encounter Date: 09/24/2016      OT End of Session - 09/24/16 1707    Visit Number 10   Number of Visits 24   Date for OT Re-Evaluation 10/18/16   OT Start Time 1430   Activity Tolerance Patient tolerated treatment well   Behavior During Therapy [facility] for tasks assessed/performed      Past Medical History:  Diagnosis Date  . Allergy   . Constipation   . GERD (gastroesophageal reflux disease)   . Pitt-Hopkins syndrome     No past surgical history on file.  There were no vitals filed for this visit.      Subjective Assessment - 09/24/16 1705    Subjective  Ariel Powell. personal care aide was not present during Ariel Powell. treatment session.   Patient is accompained by: Ariel Powell   Pertinent History Ariel Powell. 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 Ariel Powell. attends school in a [employer] classroom with the ABSS. Ariel Powell.. resideds with her parents, and has a personal care aide 4 times a week to assist with ADLs, and IADLs.   Currently in Pain? No/denies      OT TREATMENT    Neuro muscular re-education:  Ariel Powell. worked on [facility] skills choosing, and placing stickers onto cards. Ariel Powell. required cues, and assist to incorporate bilateral hand use during the task.  Therapeutic Exercise:  Ariel Powell. Worked on the [employer] for 8 min. With constant monitoring of the BUEs. Ariel Powell. Worked on changing, and alternating forward reverse position every 2 min. Rest breaks were required.                              OT Education - 09/24/16 1706    Education provided Yes   Education Details [place], bilateral hand coordination   Person(s) Educated Patient   Methods Explanation;Demonstration;Verbal cues    Comprehension Verbalized understanding;Need further instruction;Tactile cues required             OT Long Term Goals - 07/26/16 1628      OT LONG TERM GOAL #1   Title Ariel Powell. will increase grip strength by 5# to assist with ADL tasks.    Baseline Right: 12#, Left: 10#   Time 12   Period Weeks   Status New     OT LONG TERM GOAL #2   Title Ariel Powell. will increase lateral grip strength by 3# to be able to assist with ADL tasks.   Baseline Right: 3#, Left: 5#   Time 12   Period Weeks   Status New     OT LONG TERM GOAL #3   Title Ariel Powell. will improve fine motor coordination skills to assist with school related tasks.   Baseline Impaired coordination skills.   Time 12   Period Weeks   Status New     OT LONG TERM GOAL #4   Title Ariel Powell. will improve BUE strength for improved engagement in IADL tasks.   Baseline Impaired UE strength   Time 12   Period Weeks   Status New     OT LONG TERM GOAL #5   Title Ariel Powell. will formulate 3 letters in her name independently with cues.   Baseline Ariel Powell. unable  to perfrom during eval.   Time 12   Period Weeks   Status New               Plan - 09/24/16 1707    Clinical Impression Statement Ariel Powell. required increased cues for redirection, as Ariel Powell. was very distracted during treatment session today secondary to seeing an old familiar classmate. Ariel Powell. continues to work on improving Bilateral hand coordination skills.   Occupational performance deficits (Please refer to evaluation for details): ADL's   Rehab Potential Good   Current Impairments/barriers affecting progress: Positive Indicators: age, Ariel support. Negative Indicators: multiple comorbidities   OT Frequency 2x / week   OT Duration 12 weeks   OT Treatment/Interventions Self-care/ADL training;Therapeutic exercise;Manual Therapy;Neuromuscular education;DME and/or AE instruction;Energy conservation;Functional Mobility Training   Consulted and Agree with Plan of Care Ariel Powell/caregiver       Patient will benefit from skilled therapeutic intervention in order to improve the following deficits and impairments:  Decreased activity tolerance, Decreased balance, Impaired UE functional use, Decreased cognition, Decreased coordination, Decreased safety awareness, Impaired perceived functional ability, Decreased strength, Decreased knowledge of use of DME, Decreased endurance, Impaired vision/preception  Visit Diagnosis: Muscle weakness (generalized)  Other lack of coordination    Problem List There are no active problems to display for this patient.   [name], MS, OTR/L 09/24/2016, 5:17 PM  [place] MAIN [facility] SERVICES [address] [place], [place], [address] Phone: [phone]   Fax:  [phone]  Name: Ariel Powell MRN: [id number] Date of Birth: 17-Sep-1995

## 2016-09-26 ENCOUNTER — Ambulatory Visit: Payer: BC Managed Care – PPO | Admitting: Occupational Therapy

## 2016-09-26 DIAGNOSIS — M6281 Muscle weakness (generalized): Secondary | ICD-10-CM

## 2016-09-26 DIAGNOSIS — R278 Other lack of coordination: Secondary | ICD-10-CM

## 2016-09-26 NOTE — Therapy (Signed)
[place] MAIN [facility] SERVICES [address] [place], [place], [address] Phone: [phone]   Fax:  [phone]  Occupational Therapy Treatment  Patient Details  Name: Ariel Powell MRN: [id number] Date of Birth: [birth year] Referring Provider: Wynetta Emery  Encounter Date: 09/26/2016      OT End of Session - 09/26/16 1732    Visit Number 11   Number of Visits 24   Date for OT Re-Evaluation 10/18/16   OT Start Time 1530   OT Stop Time 1615   OT Time Calculation (min) 45 min   Activity Tolerance Patient tolerated treatment well   Behavior During Therapy [facility] for tasks assessed/performed      Past Medical History:  Diagnosis Date  . Allergy   . Constipation   . GERD (gastroesophageal reflux disease)   . Pitt-Hopkins syndrome     No past surgical history on file.  There were no vitals filed for this visit.      Subjective Assessment - 09/26/16 1731    Subjective  Pt. personal care aide waited in the waiting room during the session.   Patient is accompained by: Family member   Pertinent History Pt. 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 Pt. attends school in a [employer] classroom with the ABSS. Pt.. resideds with her parents, and has a personal care aide 4 times a week to assist with ADLs, and IADLs.   Currently in Pain? No/denies      OT TREATMENT    Neuro muscular re-education:  Pt. worked on [facility] sorting shapes. Pt. worked on bilateral hand coordination skills holding the ziplock bag while placing the shapes, and isolating digits to seal the bag. Pt. worked on placing, and removing pegs into a pegboard. Pt. worked on reaching to place items on various heights of shelves.  Therapeutic Exercise:  Pt. worked on the [employer] for 8 min. with constant monitoring of the BUEs. Pt. requires constant cues for repositioning of the hands on the handle.  Self-care:  Pt. Worked on using bilateral hands to place a shirt on a hanger,  fasten the snaps, and reach up into the cabinet to place them on the hook.                                   OT Education - 09/26/16 1732    Education provided Yes   Education Details [place], biltaeral hand coordination   Person(s) Educated Patient   Methods Explanation;Demonstration;Verbal cues   Comprehension Verbalized understanding;Need further instruction;Tactile cues required             OT Long Term Goals - 07/26/16 1628      OT LONG TERM GOAL #1   Title Pt. will increase grip strength by 5# to assist with ADL tasks.    Baseline Right: 12#, Left: 10#   Time 12   Period Weeks   Status New     OT LONG TERM GOAL #2   Title Pt. will increase lateral grip strength by 3# to be able to assist with ADL tasks.   Baseline Right: 3#, Left: 5#   Time 12   Period Weeks   Status New     OT LONG TERM GOAL #3   Title Pt. will improve fine motor coordination skills to assist with school related tasks.   Baseline Impaired coordination skills.   Time 12   Period Weeks  Status New     OT LONG TERM GOAL #4   Title Pt. will improve BUE strength for improved engagement in IADL tasks.   Baseline Impaired UE strength   Time 12   Period Weeks   Status New     OT LONG TERM GOAL #5   Title Pt. will formulate 3 letters in her name independently with cues.   Baseline Pt. unable to perfrom during eval.   Time 12   Period Weeks   Status New               Plan - 09/26/16 1733    Clinical Impression Statement Pt. was more attentive during trearment session than last session. Pt. continues to work on improving  UE strength, coordination, and bilateral hand control incorporating her right hand during functional tasks, ADL tasks, and IADLs.   Occupational performance deficits (Please refer to evaluation for details): ADL's   Rehab Potential Good   OT Frequency 2x / week   OT Duration 12 weeks   OT Treatment/Interventions Self-care/ADL  training;Therapeutic exercise;Manual Therapy;Neuromuscular education;DME and/or AE instruction;Energy conservation;Functional Mobility Training   Consulted and Agree with Plan of Care Family member/caregiver   Family Member Consulted Personal care aide      Patient will benefit from skilled therapeutic intervention in order to improve the following deficits and impairments:  Decreased activity tolerance, Decreased balance, Impaired UE functional use, Decreased cognition, Decreased coordination, Decreased safety awareness, Impaired perceived functional ability, Decreased strength, Decreased knowledge of use of DME, Decreased endurance, Impaired vision/preception  Visit Diagnosis: Muscle weakness (generalized)  Other lack of coordination    Problem List There are no active problems to display for this patient.   Ariel Carina, MS, OTR/L 09/26/2016, 5:41 PM  [place] MAIN [facility] SERVICES [address] [place], [place], [address] Phone: [phone]   Fax:  [phone]  Name: Ariel Powell MRN: [id number] Date of Birth: 1995-05-08

## 2016-10-01 ENCOUNTER — Encounter: Payer: Self-pay | Admitting: Occupational Therapy

## 2016-10-01 ENCOUNTER — Ambulatory Visit: Payer: BC Managed Care – PPO | Admitting: Occupational Therapy

## 2016-10-01 DIAGNOSIS — M6281 Muscle weakness (generalized): Secondary | ICD-10-CM | POA: Diagnosis not present

## 2016-10-01 DIAGNOSIS — R278 Other lack of coordination: Secondary | ICD-10-CM

## 2016-10-01 NOTE — Therapy (Signed)
[place] MAIN [facility] SERVICES [address] [place], [place], [address] Phone: [phone]   Fax:  [phone]  Occupational Therapy Treatment  Patient Details  Name: Ariel Powell MRN: [id number] Date of Birth: 1995/07/09 Referring Provider: Wynetta Emery  Encounter Date: 10/01/2016      OT End of Session - 10/01/16 1812    Visit Number 12   Number of Visits 24   Date for OT Re-Evaluation 10/18/16   OT Start Time 0093   OT Stop Time 1615   OT Time Calculation (min) 45 min   Activity Tolerance Patient tolerated treatment well   Behavior During Therapy [facility] for tasks assessed/performed      Past Medical History:  Diagnosis Date  . Allergy   . Constipation   . GERD (gastroesophageal reflux disease)   . Pitt-Hopkins syndrome     History reviewed. No pertinent surgical history.  There were no vitals filed for this visit.      Subjective Assessment - 10/01/16 1810    Subjective  Pt. is responding well during session, smiling alot.   Patient is accompained by: Family member   Pertinent History Pt. 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 Pt. attends school in a [employer] classroom with the ABSS. Pt.. resideds with her parents, and has a personal care aide 4 times a week to assist with ADLs, and IADLs.   Currently in Pain? No/denies        OT TREATMENT     Neuro muscular re-education:  Pt. Worked on [facility] skills sorting flowers. Using her bilateral hand coordination with verbal, and tactile cues. Pt. Worked on floral arrangements using silk flowers. Pt. Worked on grasping bold colored pegs, and reaching to place them in a container. Pt. Worked on grasping 1/2" pegs, and placing them in an extra small slot.  Therapeutic Exercise:  Pt. Worked on the [employer] for 8 min. With constant monitoring of the BUEs. Pt. Worked on changing, and alternating forward reverse position every 2 min. Rest breaks were required.                          OT Education - 10/01/16 1811    Education provided Yes   Education Details [place], bilateral hand coordination skills   Person(s) Educated Patient   Methods Explanation;Demonstration;Verbal cues   Comprehension Verbalized understanding;Need further instruction;Tactile cues required             OT Long Term Goals - 07/26/16 1628      OT LONG TERM GOAL #1   Title Pt. will increase grip strength by 5# to assist with ADL tasks.    Baseline Right: 12#, Left: 10#   Time 12   Period Weeks   Status New     OT LONG TERM GOAL #2   Title Pt. will increase lateral grip strength by 3# to be able to assist with ADL tasks.   Baseline Right: 3#, Left: 5#   Time 12   Period Weeks   Status New     OT LONG TERM GOAL #3   Title Pt. will improve fine motor coordination skills to assist with school related tasks.   Baseline Impaired coordination skills.   Time 12   Period Weeks   Status New     OT LONG TERM GOAL #4   Title Pt. will improve BUE strength for improved engagement in IADL tasks.   Baseline Impaired UE strength  Time 12   Period Weeks   Status New     OT LONG TERM GOAL #5   Title Pt. will formulate 3 letters in her name independently with cues.   Baseline Pt. unable to perfrom during eval.   Time 12   Period Weeks   Status New               Plan - 10/01/16 1812    Clinical Impression Statement Pt. is engaing in session, with minimal cues for redirection. Pt. continues to work on bilateral hand coordination skills. Pt. requires cues to initiate with her right hand for tasks. Pt. continues to work on improving UE functioning, and bilateral hand coordination for use during ADLs, and IADLs..   Occupational performance deficits (Please refer to evaluation for details): ADL's   Rehab Potential Good   Current Impairments/barriers affecting progress: Positive Indicators: age, family support. Negative Indicators: multiple  comorbidities   OT Frequency 2x / week   OT Duration 12 weeks   OT Treatment/Interventions Self-care/ADL training;Therapeutic exercise;Manual Therapy;Neuromuscular education;DME and/or AE instruction;Energy conservation;Functional Mobility Training   Consulted and Agree with Plan of Care Family member/caregiver   Family Member Consulted Personal care aide      Patient will benefit from skilled therapeutic intervention in order to improve the following deficits and impairments:  Decreased activity tolerance, Decreased balance, Impaired UE functional use, Decreased cognition, Decreased coordination, Decreased safety awareness, Impaired perceived functional ability, Decreased strength, Decreased knowledge of use of DME, Decreased endurance, Impaired vision/preception  Visit Diagnosis: Muscle weakness (generalized)  Other lack of coordination    Problem List There are no active problems to display for this patient.   [name], MS, OTR/L 10/01/2016, 6:17 PM  [place] MAIN [facility] SERVICES [address] [place], [place], [address] Phone: [phone]   Fax:  [phone]  Name: Ariel Powell MRN: [id number] Date of Birth: 11-14-1995

## 2016-10-03 ENCOUNTER — Ambulatory Visit: Payer: BC Managed Care – PPO | Admitting: Occupational Therapy

## 2016-10-03 ENCOUNTER — Encounter: Payer: Self-pay | Admitting: Occupational Therapy

## 2016-10-03 DIAGNOSIS — R278 Other lack of coordination: Secondary | ICD-10-CM

## 2016-10-03 DIAGNOSIS — M6281 Muscle weakness (generalized): Secondary | ICD-10-CM

## 2016-10-03 NOTE — Therapy (Signed)
[place] MAIN [facility] SERVICES [address] [place], [place], [address] Phone: [phone]   Fax:  [phone]  Occupational Therapy Treatment  Patient Details  Name: KEILEE DENMAN MRN: [id number] Date of Birth: 10/16/1995 Referring Provider: Wynetta Emery  Encounter Date: 10/03/2016      OT End of Session - 10/03/16 1741    Visit Number 13   Number of Visits 24   Date for OT Re-Evaluation 10/18/16   OT Start Time 6378   OT Stop Time 1700   OT Time Calculation (min) 45 min   Activity Tolerance Patient tolerated treatment well   Behavior During Therapy [facility] for tasks assessed/performed      Past Medical History:  Diagnosis Date  . Allergy   . Constipation   . GERD (gastroesophageal reflux disease)   . Pitt-Hopkins syndrome     History reviewed. No pertinent surgical history.  There were no vitals filed for this visit.      Subjective Assessment - 10/03/16 1740    Subjective  Pt. appears happy, and engaged in the tasks   Patient is accompained by: Family member   Pertinent History Pt. is a 21 y.o. female who has [employer] Syndrome. Pt. attends school in a [employer] classroom with the ABSS. Pt.. resideds with her parents, and has a personal care aide 4 times a week to assist with ADLs, and IADLs.   Currently in Pain? No/denies      OT TREATMENT    Neuro muscular re-education:  Pt. worked on Production manager. Pt. worked on using her right hand to grasp, sort, and place the marbles into cups.   Therapeutic Exercise:  Pt. Worked on the [employer] for 8 min. With constant monitoring of the BUEs. Pt. Worked on changing, and alternating forward reverse position every 2 min. Rest breaks were required.   Selfcare:  Pt. worked on [employer] tasks. Pt. worked on using bilateral hands to place bowls in cabinets. Pt. worked on wiping countertop surfaces, and emptying the dishwasher, and placing dishes in the cabinet. Pt. Worked  on folding towels, stacking, and placing them in the cabinet incorporating her RUE.                              OT Education - 10/03/16 1741    Education provided Yes   Education Details bilateral hand coordination   Person(s) Educated Patient   Methods Explanation;Demonstration;Verbal cues   Comprehension Verbalized understanding;Need further instruction;Tactile cues required             OT Long Term Goals - 07/26/16 1628      OT LONG TERM GOAL #1   Title Pt. will increase grip strength by 5# to assist with ADL tasks.    Baseline Right: 12#, Left: 10#   Time 12   Period Weeks   Status New     OT LONG TERM GOAL #2   Title Pt. will increase lateral grip strength by 3# to be able to assist with ADL tasks.   Baseline Right: 3#, Left: 5#   Time 12   Period Weeks   Status New     OT LONG TERM GOAL #3   Title Pt. will improve fine motor coordination skills to assist with school related tasks.   Baseline Impaired coordination skills.   Time 12   Period Weeks   Status New     OT LONG TERM  GOAL #4   Title Pt. will improve BUE strength for improved engagement in IADL tasks.   Baseline Impaired UE strength   Time 12   Period Weeks   Status New     OT LONG TERM GOAL #5   Title Pt. will formulate 3 letters in her name independently with cues.   Baseline Pt. unable to perfrom during eval.   Time 12   Period Weeks   Status New               Plan - 10/03/16 1742    Clinical Impression Statement Pt. conitnues to work on impaorving bilateral hand coordination skills during ADL, and IADL tasks. Pt. worked on improving [facility] skills, with the right, and left hands. Pt. worked on on IADL tasks. Pt. requires verbal cues and assist during each task.   Occupational performance deficits (Please refer to evaluation for details): ADL's   Rehab Potential Good   Current Impairments/barriers affecting progress: Positive Indicators: age, family support.  Negative Indicators: multiple comorbidities   OT Frequency 2x / week   OT Duration 12 weeks   OT Treatment/Interventions Self-care/ADL training;Therapeutic exercise;Manual Therapy;Neuromuscular education;DME and/or AE instruction;Energy conservation;Functional Mobility Training   Consulted and Agree with Plan of Care Family member/caregiver   Family Member Consulted Personal care aide      Patient will benefit from skilled therapeutic intervention in order to improve the following deficits and impairments:  Decreased activity tolerance, Decreased balance, Impaired UE functional use, Decreased cognition, Decreased coordination, Decreased safety awareness, Impaired perceived functional ability, Decreased strength, Decreased knowledge of use of DME, Decreased endurance, Impaired vision/preception  Visit Diagnosis: Muscle weakness (generalized)  Other lack of coordination    Problem List There are no active problems to display for this patient.   [name], MS, OTR/L 10/03/2016, 5:47 PM  [place] MAIN [facility] SERVICES [address] [place], [place], [address] Phone: [phone]   Fax:  [phone]  Name: LATISA BELAY MRN: [id number] Date of Birth: 12/31/1995

## 2016-10-08 ENCOUNTER — Ambulatory Visit: Payer: BC Managed Care – PPO | Admitting: Occupational Therapy

## 2016-10-10 ENCOUNTER — Ambulatory Visit: Payer: BC Managed Care – PPO | Admitting: Occupational Therapy

## 2016-10-10 DIAGNOSIS — R278 Other lack of coordination: Secondary | ICD-10-CM

## 2016-10-10 DIAGNOSIS — M6281 Muscle weakness (generalized): Secondary | ICD-10-CM

## 2016-10-10 NOTE — Therapy (Signed)
[place] MAIN [facility] SERVICES [address] [place], [place], [address] Phone: [phone]   Fax:  [phone]  Occupational Therapy Treatment  Patient Details  Name: Ariel Powell MRN: [id number] Date of Birth: 1995-10-25 Referring Provider: Wynetta Emery  Encounter Date: 10/10/2016      OT End of Session - 10/10/16 1745    Visit Number 14   Number of Visits 24   Date for OT Re-Evaluation 10/18/16   OT Start Time 1545   OT Stop Time 1630   OT Time Calculation (min) 45 min   Activity Tolerance Patient tolerated treatment well   Behavior During Therapy [facility] for tasks assessed/performed      Past Medical History:  Diagnosis Date  . Allergy   . Constipation   . GERD (gastroesophageal reflux disease)   . Pitt-Hopkins syndrome     No past surgical history on file.  There were no vitals filed for this visit.      Subjective Assessment - 10/10/16 1744    Subjective  Pt. appears very happy, and engaged in the tasks.   Currently in Pain? No/denies      OT TREATMENT    Neuro muscular re-education:  Pt. Worked on bilateral UE reaching, and placing various sizes, and colors of pegs into a board. Pt. Worked on switching pegs from one hand to another.   Therapeutic Exercise:Pt. Worked on the [employer] for 8 min. With constant monitoring of the BUEs. Pt. Worked on changing, and alternating forward reverse position every 2 min. Rest breaks were required. Frequent verbal cues, and assist to change hand position was required.  Selfcare:  Pt. Worked on IADL leisure task using an aqua brush to paint at scene. Pt. Worked on filling the aqua brush at the sink, moving, and removing the cap, and brushing using various strokes laterally, and vertically. Pt. Worked on a squeezing the aqua pen using a lateral pinch grasp in preparation for grasping a utensil.                           OT Education - 10/10/16 1745    Education  provided Yes   Education Details bilateral hand coordination   Person(s) Educated Patient   Methods Explanation;Demonstration;Verbal cues   Comprehension Verbalized understanding;Need further instruction;Tactile cues required             OT Long Term Goals - 07/26/16 1628      OT LONG TERM GOAL #1   Title Pt. will increase grip strength by 5# to assist with ADL tasks.    Baseline Right: 12#, Left: 10#   Time 12   Period Weeks   Status New     OT LONG TERM GOAL #2   Title Pt. will increase lateral grip strength by 3# to be able to assist with ADL tasks.   Baseline Right: 3#, Left: 5#   Time 12   Period Weeks   Status New     OT LONG TERM GOAL #3   Title Pt. will improve fine motor coordination skills to assist with school related tasks.   Baseline Impaired coordination skills.   Time 12   Period Weeks   Status New     OT LONG TERM GOAL #4   Title Pt. will improve BUE strength for improved engagement in IADL tasks.   Baseline Impaired UE strength   Time 12   Period Weeks   Status New  OT LONG TERM GOAL #5   Title Pt. will formulate 3 letters in her name independently with cues.   Baseline Pt. unable to perfrom during eval.   Time 12   Period Weeks   Status New               Plan - 10/10/16 1745    Clinical Impression Statement Pt. Caregiver reports she is trying to work changing her grasp on a utensil. Currently pt. Uses a gross grasp on the utensil. The [employer] soundtrack continues to motivate pt. Pt. continues to work on improving UE strength, and bilateral hand coordination skills for increased engagement in ADL, and IADL tasks. Pt. continues to require verbal, visual, and tactile cues to movement patterns.    Occupational performance deficits (Please refer to evaluation for details): ADL's   Rehab Potential Good   Current Impairments/barriers affecting progress: Positive Indicators: age, family support. Negative Indicators: multiple  comorbidities   OT Frequency 2x / week   OT Duration 12 weeks   OT Treatment/Interventions Self-care/ADL training;Therapeutic exercise;Manual Therapy;Neuromuscular education;DME and/or AE instruction;Energy conservation;Functional Mobility Training   Consulted and Agree with Plan of Care Family member/caregiver      Patient will benefit from skilled therapeutic intervention in order to improve the following deficits and impairments:  Decreased activity tolerance, Decreased balance, Impaired UE functional use, Decreased cognition, Decreased coordination, Decreased safety awareness, Impaired perceived functional ability, Decreased strength, Decreased knowledge of use of DME, Decreased endurance, Impaired vision/preception  Visit Diagnosis: Muscle weakness (generalized)  Other lack of coordination    Problem List There are no active problems to display for this patient.   [name], MS, OTR/L 10/10/2016, 5:53 PM  [place] MAIN [facility] SERVICES [address] [place], [place], [address] Phone: [phone]   Fax:  [phone]  Name: Ariel Powell MRN: [id number] Date of Birth: 02-05-1996

## 2016-10-15 ENCOUNTER — Encounter: Payer: BC Managed Care – PPO | Admitting: Occupational Therapy

## 2016-10-18 ENCOUNTER — Encounter: Payer: BC Managed Care – PPO | Admitting: Occupational Therapy

## 2016-10-22 ENCOUNTER — Encounter: Payer: BC Managed Care – PPO | Admitting: Occupational Therapy

## 2016-10-25 ENCOUNTER — Encounter: Payer: BC Managed Care – PPO | Admitting: Occupational Therapy

## 2016-10-29 ENCOUNTER — Ambulatory Visit: Payer: BC Managed Care – PPO | Attending: Pediatrics | Admitting: Occupational Therapy

## 2016-10-29 DIAGNOSIS — R278 Other lack of coordination: Secondary | ICD-10-CM | POA: Insufficient documentation

## 2016-10-29 DIAGNOSIS — M6281 Muscle weakness (generalized): Secondary | ICD-10-CM | POA: Insufficient documentation

## 2016-10-31 ENCOUNTER — Ambulatory Visit: Payer: BC Managed Care – PPO | Admitting: Occupational Therapy

## 2016-10-31 DIAGNOSIS — R278 Other lack of coordination: Secondary | ICD-10-CM | POA: Diagnosis present

## 2016-10-31 DIAGNOSIS — M6281 Muscle weakness (generalized): Secondary | ICD-10-CM

## 2016-10-31 NOTE — Therapy (Signed)
[place] MAIN [facility] SERVICES [address] [place], [place], [address] Phone: [phone]   Fax:  [phone]  Occupational Therapy Treatment/Recertification Note  Patient Details  Name: YAIRA BERNARDI MRN: [id number] Date of Birth: 02-01-1996 Referring Provider: Wynetta Emery  Encounter Date: 10/31/2016      OT End of Session - 10/31/16 1718    Visit Number 15   Number of Visits 24   Date for OT Re-Evaluation 01/23/17   OT Start Time 1545   OT Stop Time 1615   OT Time Calculation (min) 30 min   Activity Tolerance Patient tolerated treatment well   Behavior During Therapy [facility] for tasks assessed/performed      Past Medical History:  Diagnosis Date  . Allergy   . Constipation   . GERD (gastroesophageal reflux disease)   . Pitt-Hopkins syndrome     No past surgical history on file.  There were no vitals filed for this visit.      Subjective Assessment - 10/31/16 1715    Subjective  Pt. was on vacation with her family.   Patient is accompained by: Family member   Pertinent History Pt. 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 Pt. attends school in a [employer] classroom with the ABSS. Pt.. resideds with her parents, and has a personal care aide 4 times a week to assist with ADLs, and IADLs.   Currently in Pain? No/denies      OT Treatment  Selfcare:  Pt. goals were reviewed with pt., and mother. Pt. worked on grasping a utensil without using a gross grip. Pt. was provided with a built-up foam handle for the utensil.                           OT Education - 10/31/16 1716    Education provided Yes   Person(s) Educated Patient   Methods Explanation   Comprehension Verbalized understanding             OT Long Term Goals - 10/31/16 1729      OT LONG TERM GOAL #1   Title Pt. will increase grip strength by 5# to assist with ADL tasks.    Baseline Right: 12#, Left: 10#   Time 12   Period Weeks    Status Deferred     OT LONG TERM GOAL #2   Title Pt. will increase lateral grip strength by 3# to be able to assist with ADL tasks.   Baseline Right: 3#, Left: 5#, 10/31/2016: Right: 5#, L: 9#   Time 12   Period Weeks   Status Partially Met     OT LONG TERM GOAL #3   Title Pt. will improve fine motor coordination skills to assist with school related tasks.   Baseline Impaired coordination skills, : 10/31/2016: 9 hole peg test: right: 1 min. & 24 sec., Left 47 sec.   Time 12   Period Weeks   Status On-going     OT LONG TERM GOAL #4   Title Pt. will improve BUE strength for improved engagement in IADL tasks.   Baseline Impaired UE strength   Time 12   Period Weeks   Status On-going     OT LONG TERM GOAL #5   Title Pt. will formulate 3 letters in her name independently with cues.   Baseline Pt. unable to perfrom during eval.   Time 12   Period Weeks   Status On-going  Long Term Additional Goals   Additional Long Term Goals Yes     OT LONG TERM GOAL #6   Title Pt. will utilize a 3pt grasp on a utensil while independently scooping food.   Baseline 3pt. pinch: Right: 6#, Left: 5#   Time 12   Period Weeks   Status New     OT LONG TERM GOAL #7   Title Pt. will independently perform stirring.   Baseline Pt. is unable   Time 12   Period Weeks   Status New     OT LONG TERM GOAL #8   Title Pt. will independently wash tabletop, and countertop surfaces initiating, and using a flat hand.   Baseline Pt. is unable to keep her hand flat   Time 12   Period Weeks     OT LONG TERM GOAL  #9   Baseline Pt. will use her IPAD communication board with 100% accuracy with appropriate grid modifications.    Time 12   Period Weeks   Status New               Plan - 10/31/16 1721    Clinical Impression Statement Pt. was on vacation at [place] over the past few weeks. Pt.'s mother attended  this session, and goals were reviewed and updated with pt., and her mother. Pt. has made  progress overall, and is attempting to use her right hand more during daily tasks. Pt. continues to require verbal cues, and assist. Pt. continues to benefit from OT services to work on improving bilateral hand coordination, self-feeding, and BUE, and hand functioning for improved engagement in ADLs, and IADLs.   Occupational performance deficits (Please refer to evaluation for details): ADL's   Rehab Potential Good   Current Impairments/barriers affecting progress: Positive Indicators: age, family support. Negative Indicators: multiple comorbidities   OT Frequency 2x / week   OT Duration 12 weeks   OT Treatment/Interventions Self-care/ADL training;Therapeutic exercise;Manual Therapy;Neuromuscular education;DME and/or AE instruction;Energy conservation;Functional Mobility Training   Consulted and Agree with Plan of Care Family member/caregiver      Patient will benefit from skilled therapeutic intervention in order to improve the following deficits and impairments:  Decreased activity tolerance, Decreased balance, Impaired UE functional use, Decreased cognition, Decreased coordination, Decreased safety awareness, Impaired perceived functional ability, Decreased strength, Decreased knowledge of use of DME, Decreased endurance, Impaired vision/preception  Visit Diagnosis: Muscle weakness (generalized)    Problem List There are no active problems to display for this patient.   [name], MS, OTR/L 10/31/2016, 5:45 PM  [place] MAIN [facility] SERVICES [address] [place], [place], [address] Phone: [phone]   Fax:  [phone]  Name: SHAINDY READER MRN: [id number] Date of Birth: [birth year]

## 2016-10-31 NOTE — Addendum Note (Signed)
Addended by: Lucia Bitter on: 10/31/2016 06:02 PM   Modules accepted: Orders

## 2016-11-05 ENCOUNTER — Ambulatory Visit: Payer: BC Managed Care – PPO | Admitting: Occupational Therapy

## 2016-11-05 DIAGNOSIS — R278 Other lack of coordination: Secondary | ICD-10-CM

## 2016-11-05 DIAGNOSIS — M6281 Muscle weakness (generalized): Secondary | ICD-10-CM | POA: Diagnosis not present

## 2016-11-05 NOTE — Therapy (Signed)
[place] MAIN [facility] SERVICES [address] [place], [place], [address] Phone: [phone]   Fax:  [phone]  Occupational Therapy Treatment  Patient Details  Name: Ariel Powell MRN: [id number] Date of Birth: 10/13/1995 Referring Provider: Wynetta Emery  Encounter Date: 11/05/2016      OT End of Session - 11/05/16 1708    Visit Number 16   Number of Visits 24   Date for OT Re-Evaluation 01/23/17   OT Start Time 1537   OT Stop Time 1615   OT Time Calculation (min) 38 min   Activity Tolerance Patient tolerated treatment well   Behavior During Therapy [facility] for tasks assessed/performed      Past Medical History:  Diagnosis Date  . Allergy   . Constipation   . GERD (gastroesophageal reflux disease)   . Pitt-Hopkins syndrome     No past surgical history on file.  There were no vitals filed for this visit.      Subjective Assessment - 11/05/16 1707    Subjective  Pt. was present with her family.   Patient is accompained by: Family member   Pertinent History Pt. 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 Pt. attends school in a [employer] classroom with the ABSS. Pt.. resideds with her parents, and has a personal care aide 4 times a week to assist with ADLs, and IADLs.   Currently in Pain? No/denies      OT TREATMENT     Therapeutic Exercise:  Pt. worked on the [employer] for 8 min. with constant monitoring of the BUEs.  Verbal cues were required.   Selfcare:  Pt. worked on IADL leisure task with an Aqua brush. Pt. worked on using a 3pt. grasp to squeeze, and release to paint a picture. Pt. worked on wiping tabletops, and countertop surfaces with a dry washcloth. Emphasis was placed on opening her left, and right hands, and keeping them open during the remainder of the task. Pt. Was provided with additional built-up foam handles.                            OT Education - 11/05/16 1708    Education provided  Yes   Education Details Bilateral hand use   Person(s) Educated Patient   Methods Explanation   Comprehension Verbalized understanding             OT Long Term Goals - 10/31/16 1729      OT LONG TERM GOAL #1   Title Pt. will increase grip strength by 5# to assist with ADL tasks.    Baseline Right: 12#, Left: 10#   Time 12   Period Weeks   Status Deferred     OT LONG TERM GOAL #2   Title Pt. will increase lateral grip strength by 3# to be able to assist with ADL tasks.   Baseline Right: 3#, Left: 5#, 10/31/2016: Right: 5#, L: 9#   Time 12   Period Weeks   Status Partially Met     OT LONG TERM GOAL #3   Title Pt. will improve fine motor coordination skills to assist with school related tasks.   Baseline Impaired coordination skills, : 10/31/2016: 9 hole peg test: right: 1 min. & 24 sec., Left 47 sec.   Time 12   Period Weeks   Status On-going     OT LONG TERM GOAL #4   Title Pt. will improve BUE strength  for improved engagement in IADL tasks.   Baseline Impaired UE strength   Time 12   Period Weeks   Status On-going     OT LONG TERM GOAL #5   Title Pt. will formulate 3 letters in her name independently with cues.   Baseline Pt. unable to perfrom during eval.   Time 12   Period Weeks   Status On-going     Long Term Additional Goals   Additional Long Term Goals Yes     OT LONG TERM GOAL #6   Title Pt. will utilize a 3pt grasp on a utensil while independently scooping food.   Baseline 3pt. pinch: Right: 6#, Left: 5#   Time 12   Period Weeks   Status New     OT LONG TERM GOAL #7   Title Pt. will independently perform stirring.   Baseline Pt. is unable   Time 12   Period Weeks   Status New     OT LONG TERM GOAL #8   Title Pt. will independently wash tabletop, and countertop surfaces initiating, and using a flat hand.   Baseline Pt. is unable to keep her hand flat   Time 12   Period Weeks     OT LONG TERM GOAL  #9   Baseline Pt. will use her IPAD  communication board with 100% accuracy with appropriate grid modifications.    Time 12   Period Weeks   Status New               Plan - 11/05/16 1709    Clinical Impression Statement Pt. is planning to go to the [facility] in [place], and will not be able to return to therapy until next week. Pt.'s mother reports pt. was able to the built-up foam handles for utensils. Pt.'s mother dug out spaces on the foam for pt.'s finger placement. Pt.'s mother reports pt. was able to use a 3pt. grasp on the utensil.  Pt. continues to work on engaging bilateral UE during ADLs, adn IADLs.   Occupational performance deficits (Please refer to evaluation for details): ADL's   Rehab Potential Good   Current Impairments/barriers affecting progress: Positive Indicators: age, family support. Negative Indicators: multiple comorbidities   OT Frequency 2x / week   OT Duration 12 weeks   OT Treatment/Interventions Self-care/ADL training;Therapeutic exercise;Manual Therapy;Neuromuscular education;DME and/or AE instruction;Energy conservation;Functional Mobility Training   Consulted and Agree with Plan of Care Family member/caregiver   Family Member Consulted Personal care aide      Patient will benefit from skilled therapeutic intervention in order to improve the following deficits and impairments:  Decreased activity tolerance, Decreased balance, Impaired UE functional use, Decreased cognition, Decreased coordination, Decreased safety awareness, Impaired perceived functional ability, Decreased strength, Decreased knowledge of use of DME, Decreased endurance, Impaired vision/preception  Visit Diagnosis: Muscle weakness (generalized)  Other lack of coordination    Problem List There are no active problems to display for this patient.   [name], MS, OTR/L 11/05/2016, 5:19 PM  [place] MAIN [facility] SERVICES [address] [place], [place],  [address] Phone: [phone]   Fax:  [phone]  Name: JADASIA HAWS MRN: [id number] Date of Birth: 01-15-1996

## 2016-11-07 ENCOUNTER — Encounter: Payer: BC Managed Care – PPO | Admitting: Occupational Therapy

## 2016-11-12 ENCOUNTER — Encounter: Payer: BC Managed Care – PPO | Admitting: Occupational Therapy

## 2016-11-14 ENCOUNTER — Encounter: Payer: BC Managed Care – PPO | Admitting: Occupational Therapy

## 2016-11-19 ENCOUNTER — Ambulatory Visit: Payer: BC Managed Care – PPO | Attending: Pediatrics | Admitting: Occupational Therapy

## 2016-11-19 DIAGNOSIS — M6281 Muscle weakness (generalized): Secondary | ICD-10-CM | POA: Insufficient documentation

## 2016-11-21 ENCOUNTER — Ambulatory Visit: Payer: BC Managed Care – PPO | Admitting: Occupational Therapy

## 2016-11-27 ENCOUNTER — Ambulatory Visit: Payer: BC Managed Care – PPO | Admitting: Occupational Therapy

## 2016-11-28 ENCOUNTER — Ambulatory Visit: Payer: BC Managed Care – PPO | Admitting: Occupational Therapy

## 2016-11-28 DIAGNOSIS — M6281 Muscle weakness (generalized): Secondary | ICD-10-CM

## 2016-11-28 NOTE — Therapy (Addendum)
[place] MAIN [facility] SERVICES [address] [place], [place], [address] Phone: [phone]   Fax:  [phone]  Occupational Therapy Treatment  Patient Details  Name: LAURELLE SKIVER MRN: [id number] Date of Birth: 03/20/1996 Referring Provider: Wynetta Emery  Encounter Date: 11/28/2016      OT End of Session - 11/28/16 1209    Visit Number 17   Number of Visits 24   Date for OT Re-Evaluation 01/23/17   OT Start Time 1050   OT Stop Time 1117   OT Time Calculation (min) 27 min   Activity Tolerance Patient tolerated treatment well   Behavior During Therapy [facility] for tasks assessed/performed      Past Medical History:  Diagnosis Date  . Allergy   . Constipation   . GERD (gastroesophageal reflux disease)   . Pitt-Hopkins syndrome     No past surgical history on file.  There were no vitals filed for this visit.      Subjective Assessment - 11/28/16 1208    Subjective  Pt. was very late for this treatment session. Pt.'s neighbor brought her to therapy.   Patient is accompained by: Family member   Pertinent History Pt. 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 Pt. attends school in a [employer] classroom with the [employer]. Pt.. resideds with her parents, and has a personal care aide 4 times a week to assist with ADLs, and IADLs.   Currently in Pain? No/denies        OT TREATMENT    Neuro muscular re-education:  Therapeutic Exercise:  Pt. worked on the [employer] for 8 min. with constant monitoring of the BUEs. Cues were required to maintain momentum. Pt. worked on pinch strengthening in the left hand for lateral, and 3pt. pinch using red, and green resistive clips. Pt. worked on placing the clips at various vertical and horizontal angles. Tactile and verbal cues were required for eliciting the desired movement.  Selfcare:  Pt. Worked on opening maintaining an open hand with digits extended while wiping, and cleaning various height  surfaces.                          OT Education - 11/28/16 1210    Education provided Yes   Education Details bilateral hand use   Person(s) Educated Patient   Methods Explanation   Comprehension Verbalized understanding             OT Long Term Goals - 10/31/16 1729      OT LONG TERM GOAL #1   Title Pt. will increase grip strength by 5# to assist with ADL tasks.    Baseline Right: 12#, Left: 10#   Time 12   Period Weeks   Status Deferred     OT LONG TERM GOAL #2   Title Pt. will increase lateral grip strength by 3# to be able to assist with ADL tasks.   Baseline Right: 3#, Left: 5#, 10/31/2016: Right: 5#, L: 9#   Time 12   Period Weeks   Status Partially Met     OT LONG TERM GOAL #3   Title Pt. will improve fine motor coordination skills to assist with school related tasks.   Baseline Impaired coordination skills, : 10/31/2016: 9 hole peg test: right: 1 min. & 24 sec., Left 47 sec.   Time 12   Period Weeks   Status On-going     OT LONG TERM GOAL #4  Title Pt. will improve BUE strength for improved engagement in IADL tasks.   Baseline Impaired UE strength   Time 12   Period Weeks   Status On-going     OT LONG TERM GOAL #5   Title Pt. will formulate 3 letters in her name independently with cues.   Baseline Pt. unable to perfrom during eval.   Time 12   Period Weeks   Status On-going     Long Term Additional Goals   Additional Long Term Goals Yes     OT LONG TERM GOAL #6   Title Pt. will utilize a 3pt grasp on a utensil while independently scooping food.   Baseline 3pt. pinch: Right: 6#, Left: 5#   Time 12   Period Weeks   Status New     OT LONG TERM GOAL #7   Title Pt. will independently perform stirring.   Baseline Pt. is unable   Time 12   Period Weeks   Status New     OT LONG TERM GOAL #8   Title Pt. will independently wash tabletop, and countertop surfaces initiating, and using a flat hand.   Baseline Pt. is unable to  keep her hand flat   Time 12   Period Weeks     OT LONG TERM GOAL  #9   Baseline Pt. will use her IPAD communication board with 100% accuracy with appropriate grid modifications.    Time 12   Period Weeks   Status New               Plan - 11/28/16 1211    Clinical Impression Statement Pt.'s neighbor brought pt. to the session, and reports the pt.'s mother has moved to start a job outside of [place]. [place] Pt.'s neighbor was unsure about the timeline, or when the pt. would be back to OT. They report it has all happened very suddenly. Pt. continues to work on improving engagement of UEs in ADL, and IADL tasks.   Occupational performance deficits (Please refer to evaluation for details): ADL's   Rehab Potential Good   Current Impairments/barriers affecting progress: Positive Indicators: age, family support. Negative Indicators: multiple comorbidities   OT Frequency 2x / week   OT Duration 12 weeks   OT Treatment/Interventions Self-care/ADL training;Therapeutic exercise;Manual Therapy;Neuromuscular education;DME and/or AE instruction;Energy conservation;Functional Mobility Training   Consulted and Agree with Plan of Care Family member/caregiver      Patient will benefit from skilled therapeutic intervention in order to improve the following deficits and impairments:  Decreased activity tolerance, Decreased balance, Impaired UE functional use, Decreased cognition, Decreased coordination, Decreased safety awareness, Impaired perceived functional ability, Decreased strength, Decreased knowledge of use of DME, Decreased endurance, Impaired vision/preception  Visit Diagnosis: Muscle weakness (generalized)    Problem List There are no active problems to display for this patient.   [name], MS, OTR/L 11/28/2016, 12:16 PM  [place] MAIN [facility] SERVICES [address]  [place], [place], [address] Phone: [phone]   Fax:  [phone]  Name: KADESHIA KASPARIAN MRN: [id number] Date of Birth: July 20, 1995

## 2016-12-05 ENCOUNTER — Ambulatory Visit: Payer: BC Managed Care – PPO | Admitting: Occupational Therapy

## 2016-12-11 ENCOUNTER — Ambulatory Visit: Payer: BC Managed Care – PPO | Admitting: Occupational Therapy

## 2016-12-18 ENCOUNTER — Ambulatory Visit: Payer: BC Managed Care – PPO | Admitting: Occupational Therapy

## 2016-12-20 ENCOUNTER — Ambulatory Visit: Payer: BC Managed Care – PPO | Admitting: Occupational Therapy

## 2016-12-24 ENCOUNTER — Encounter: Payer: BC Managed Care – PPO | Admitting: Occupational Therapy

## 2016-12-26 ENCOUNTER — Encounter: Payer: BC Managed Care – PPO | Admitting: Occupational Therapy

## 2016-12-31 ENCOUNTER — Encounter: Payer: BC Managed Care – PPO | Admitting: Occupational Therapy

## 2017-01-02 ENCOUNTER — Encounter: Payer: BC Managed Care – PPO | Admitting: Occupational Therapy

## 2017-01-07 ENCOUNTER — Encounter: Payer: BC Managed Care – PPO | Admitting: Occupational Therapy

## 2017-01-09 ENCOUNTER — Encounter: Payer: BC Managed Care – PPO | Admitting: Occupational Therapy

## 2017-01-27 IMAGING — CR DG ABDOMEN 1V
1 series · 1 of 1 positions shown · non-contrast
Comparison: 03/30/2011

CLINICAL DATA: Bloody stool

EXAM:
ABDOMEN - 1 VIEW

[dg abd 1 view]
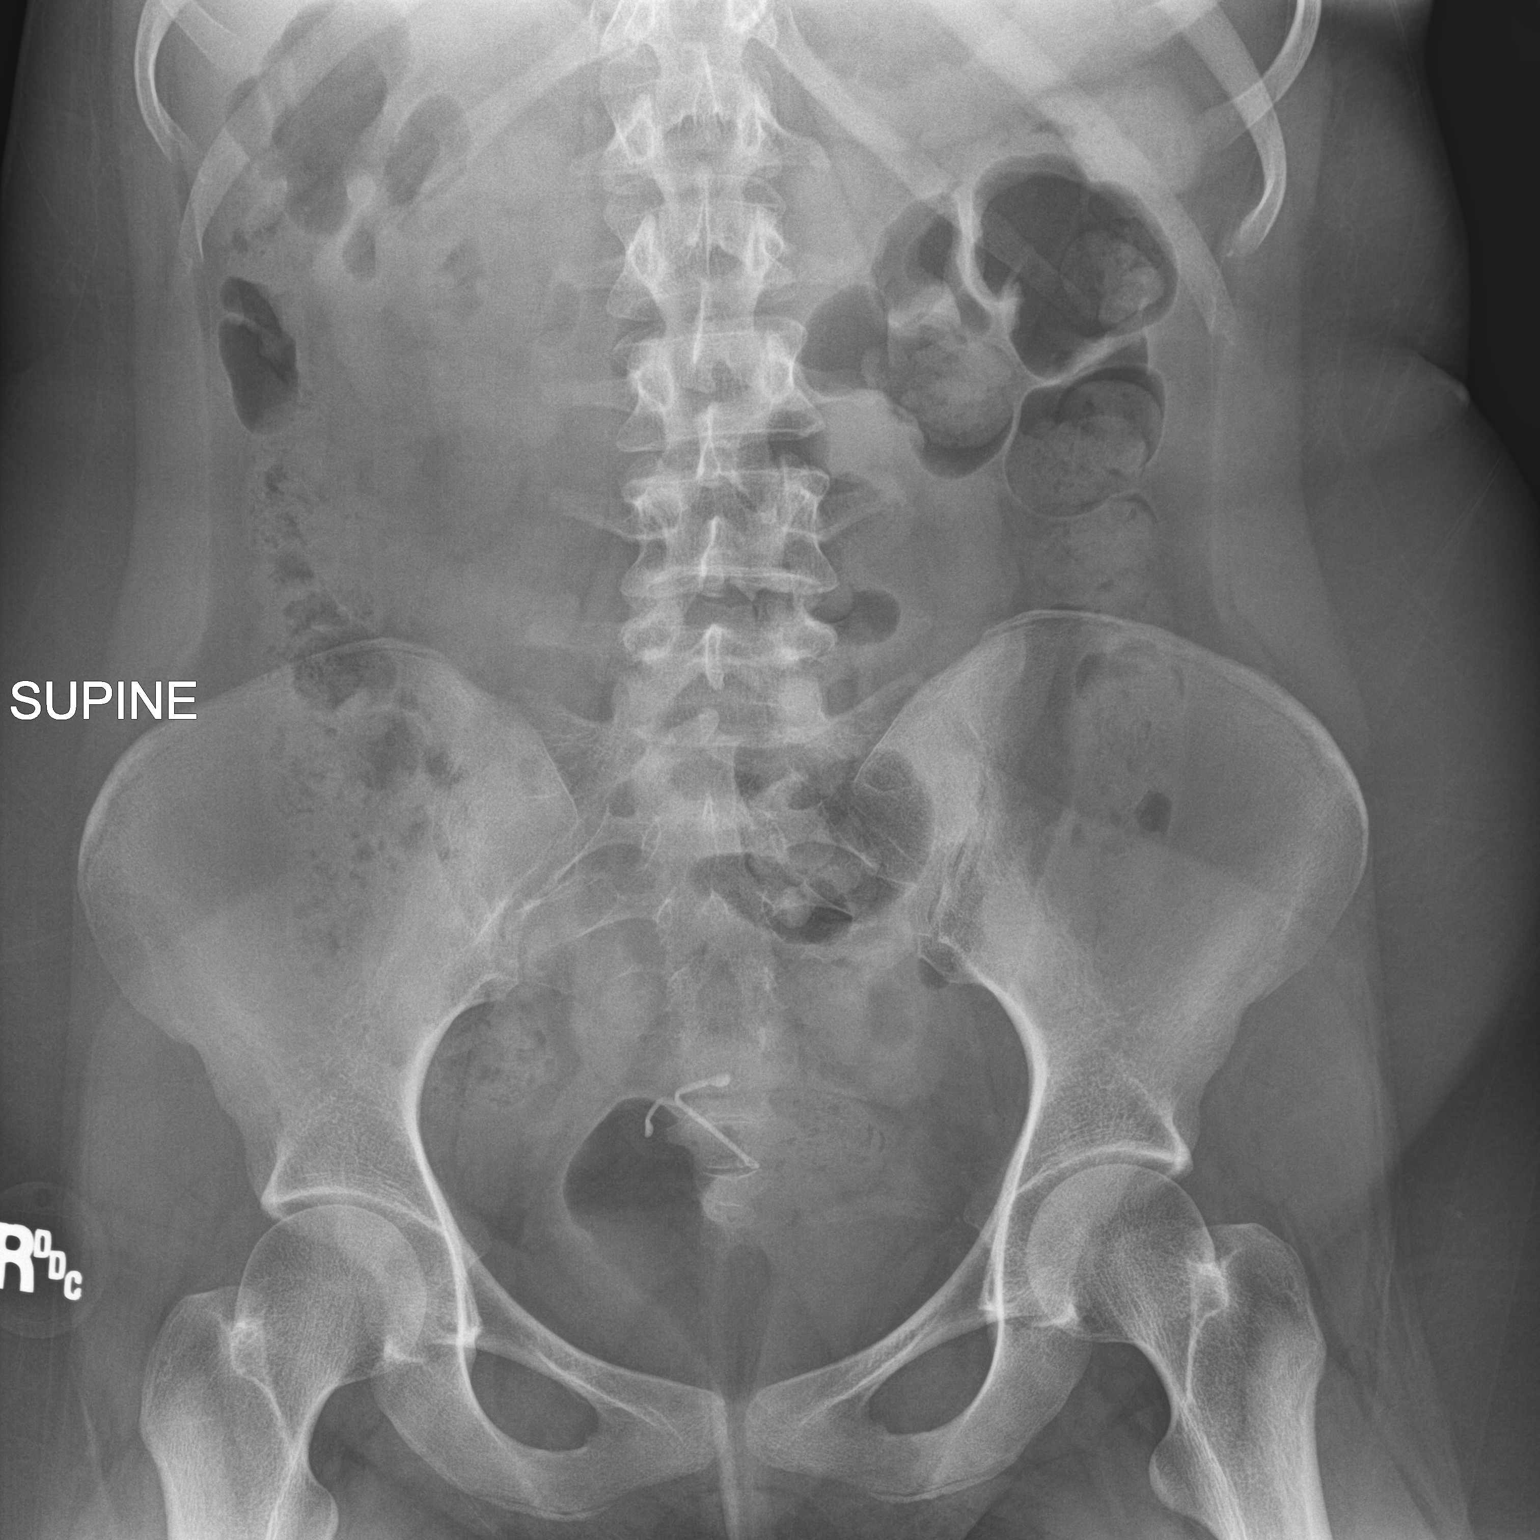

[1 of 1 positions shown; findings below may reference images not displayed]

FINDINGS: IUD projects over the midline of the pelvis. No disproportionate
dilatation of bowel. No obvious free intraperitoneal gas.
IMPRESSION: Nonobstructive bowel gas pattern.

## 2017-04-17 ENCOUNTER — Ambulatory Visit: Payer: Self-pay | Admitting: Family Medicine

## 2017-10-02 ENCOUNTER — Ambulatory Visit
Admission: RE | Admit: 2017-10-02 | Discharge: 2017-10-02 | Disposition: A | Discharge status: Home / Self Care | Payer: BC Managed Care – PPO | Source: Ambulatory Visit | Attending: Pediatrics | Admitting: Pediatrics

## 2017-10-02 ENCOUNTER — Other Ambulatory Visit: Payer: Self-pay | Admitting: Pediatrics

## 2017-10-02 DIAGNOSIS — M25571 Pain in right ankle and joints of right foot: Secondary | ICD-10-CM

## 2017-10-02 DIAGNOSIS — K5900 Constipation, unspecified: Secondary | ICD-10-CM | POA: Diagnosis present

## 2017-10-09 ENCOUNTER — Ambulatory Visit
Admission: RE | Admit: 2017-10-09 | Discharge: 2017-10-09 | Disposition: A | Discharge status: Home / Self Care | Payer: BC Managed Care – PPO | Source: Ambulatory Visit | Attending: Pediatrics | Admitting: Pediatrics

## 2017-10-09 ENCOUNTER — Other Ambulatory Visit: Payer: Self-pay | Admitting: Pediatrics

## 2017-10-09 ENCOUNTER — Encounter: Payer: Self-pay | Admitting: Emergency Medicine

## 2017-10-09 ENCOUNTER — Emergency Department
Admission: EM | Admit: 2017-10-09 | Discharge: 2017-10-10 | Disposition: A | Discharge status: Home / Self Care | Payer: BC Managed Care – PPO | Attending: Emergency Medicine | Admitting: Emergency Medicine

## 2017-10-09 DIAGNOSIS — R109 Unspecified abdominal pain: Secondary | ICD-10-CM | POA: Diagnosis present

## 2017-10-09 DIAGNOSIS — R1011 Right upper quadrant pain: Secondary | ICD-10-CM

## 2017-10-09 DIAGNOSIS — N83202 Unspecified ovarian cyst, left side: Secondary | ICD-10-CM | POA: Insufficient documentation

## 2017-10-09 DIAGNOSIS — R150 Incomplete defecation: Secondary | ICD-10-CM

## 2017-10-09 DIAGNOSIS — N83201 Unspecified ovarian cyst, right side: Secondary | ICD-10-CM

## 2017-10-09 HISTORY — DX: Other specified paralytic syndromes: G83.89

## 2017-10-09 LAB — URINALYSIS, COMPLETE (UACMP) WITH MICROSCOPIC
BILIRUBIN URINE: NEGATIVE
Glucose, UA: NEGATIVE mg/dL
Hgb urine dipstick: NEGATIVE
Ketones, ur: NEGATIVE mg/dL
Leukocytes, UA: NEGATIVE
Nitrite: NEGATIVE
PH: 6 (ref 5.0–8.0)
Protein, ur: NEGATIVE mg/dL
SPECIFIC GRAVITY, URINE: 1.016 (ref 1.005–1.030)

## 2017-10-09 LAB — BASIC METABOLIC PANEL
Anion gap: 7 (ref 5–15)
BUN: 14 mg/dL (ref 6–20)
CHLORIDE: 105 mmol/L (ref 98–111)
CO2: 27 mmol/L (ref 22–32)
CREATININE: 0.66 mg/dL (ref 0.44–1.00)
Calcium: 8.9 mg/dL (ref 8.9–10.3)
GFR calc Af Amer: >60 mL/min (ref >60)
GFR calc non Af Amer: >60 mL/min (ref >60)
Glucose, Bld: 76 mg/dL (ref 70–99)
POTASSIUM: 3.6 mmol/L (ref 3.5–5.1)
SODIUM: 139 mmol/L (ref 135–145)

## 2017-10-09 LAB — CBC
HEMATOCRIT: 39.9 % (ref 35.0–47.0)
Hemoglobin: 13.5 g/dL (ref 12.0–16.0)
MCH: 28.2 pg (ref 26.0–34.0)
MCHC: 33.8 g/dL (ref 32.0–36.0)
MCV: 83.5 fL (ref 80.0–100.0)
PLATELETS: 338 10*3/uL (ref 150–440)
RBC: 4.78 MIL/uL (ref 3.80–5.20)
RDW: 13.6 % (ref 11.5–14.5)
WBC: 9.8 10*3/uL (ref 3.6–11.0)

## 2017-10-09 NOTE — ED Triage Notes (Addendum)
Pt ambulatory to triage with steady gait, no distress noted. Pts mother reports pt was seen at Dr. Brock Ra office and advised to come to ED due to abnormal labs collected on 6/25. Insulin reading is 82, glucose is 77. 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 Pt has Hopkins syndrome.

## 2017-10-10 ENCOUNTER — Emergency Department: Payer: BC Managed Care – PPO

## 2017-10-10 DIAGNOSIS — R109 Unspecified abdominal pain: Secondary | ICD-10-CM | POA: Diagnosis not present

## 2017-10-10 LAB — GLUCOSE, CAPILLARY: GLUCOSE-CAPILLARY: 81 mg/dL (ref 70–99)

## 2017-10-10 LAB — HEPATIC FUNCTION PANEL
ALT: 21 U/L (ref 0–44)
AST: 20 U/L (ref 15–41)
Albumin: 3.9 g/dL (ref 3.5–5.0)
Alkaline Phosphatase: 106 U/L (ref 38–126)
Total Bilirubin: 0.5 mg/dL (ref 0.3–1.2)
Total Protein: 7.4 g/dL (ref 6.5–8.1)

## 2017-10-10 LAB — LIPASE, BLOOD: Lipase: 39 U/L (ref 11–51)

## 2017-10-10 LAB — PREGNANCY, URINE: PREG TEST UR: NEGATIVE

## 2017-10-10 MED ORDER — ONDANSETRON HCL 4 MG/2ML IJ SOLN
4.0000 mg | Freq: Once | INTRAMUSCULAR | Status: AC
  Administered 2017-10-10: 4 mg via INTRAVENOUS
  Filled 2017-10-10: qty 2

## 2017-10-10 MED ORDER — ONDANSETRON 4 MG PO TBDP: 4.0000 mg | ORAL_TABLET | Freq: Three times a day (TID) | ORAL | 0 refills | Status: AC | PRN

## 2017-10-10 MED ORDER — IOPAMIDOL (ISOVUE-300) INJECTION 61%
100.0000 mL | Freq: Once | INTRAVENOUS | Status: AC | PRN
  Administered 2017-10-10: 100 mL via INTRAVENOUS

## 2017-10-10 MED ORDER — SODIUM CHLORIDE 0.9 % IV BOLUS
1000.0000 mL | Freq: Once | INTRAVENOUS | Status: AC
  Administered 2017-10-10: 1000 mL via INTRAVENOUS

## 2017-10-10 NOTE — ED Notes (Addendum)
POC CBG=81mg /dL

## 2017-10-10 NOTE — ED Provider Notes (Signed)
[facility] Emergency Department Provider Note  ____________________________________________  Time seen: Approximately 12:59 AM  I have reviewed the triage vital signs and the nursing notes.   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  Patient is currently been having decreased oral intake over the past week and some vague abdominal pain.  Mother reports that due to the patient's genetic condition, its hard to localize pain on her and she is unable to provide any history.  Her primary care doctor was worried that she might develop hypoglycemia due to the high insulin level.  Reviewed her outpatient labs, insulin level was about 80 were normal reference range upper limit is about 25.  This was not a fasting test according to the mother.      Past Medical History:  Diagnosis Date  . Allergy   . Constipation   . GERD (gastroesophageal reflux disease)   . Hopkins Syndrome [facility])   . Pitt-Hopkins syndrome      There are no active problems to display for this patient.    History reviewed. No pertinent surgical history.   Prior to Admission medications   Medication Sig Start Date End Date Taking? Authorizing Provider  lansoprazole (PREVACID) 30 MG capsule Take by mouth.    [provider]  levonorgestrel (MIRENA) 20 MCG/24HR IUD by Intrauterine route.    [provider]  ondansetron (ZOFRAN ODT) 4 MG disintegrating tablet Take 1 tablet (4 mg total) by mouth every 8 (eight) hours as needed for nausea or vomiting. 10/10/17   Carrie Mew, MD  polyethylene glycol powder  [facility]) powder Take by mouth. 11/28/11   [provider]     Allergies Morphine and related and Phenergan [promethazine hcl]   History reviewed. No pertinent family history.  Social History Social History   Tobacco Use  . Smoking status: Never Smoker  . Smokeless tobacco: Never Used  Substance Use Topics  . Alcohol use: No    Alcohol/week: 0.0 oz  . Drug use: No    Review of Systems  Constitutional:   No fever or chills.  ENT:   No sore throat. No rhinorrhea. Cardiovascular:   No chest pain or syncope. Respiratory:   No dyspnea or cough. Gastrointestinal: Positive for abdominal pain and occasional vomiting.  No diarrhea.  Positive constipation, recently completed enema. Musculoskeletal:   Negative for focal pain or swelling All other systems reviewed and are negative except as documented above in ROS and HPI.  ____________________________________________   PHYSICAL EXAM:  VITAL SIGNS: ED Triage Vitals  Enc Vitals Group     BP 10/09/17 1923 121/80     Pulse Rate 10/09/17 1923 89     Resp 10/09/17 1923 17     Temp 10/09/17 1923 99.1 F (37.3 C)     Temp Source 10/09/17 1923 Oral     SpO2 10/09/17 1923 100 %     Weight 10/09/17 1924 186 lb (84.4 kg)     Height --      Head Circumference --      Peak Flow --      Pain Score --  Pain Loc --      Pain Edu? --      Excl. in [place]? --     Vital signs reviewed, nursing assessments reviewed.   Constitutional:   Alert and oriented to self. Non-toxic appearance. Eyes:   Conjunctivae are normal. EOMI. PERRL. ENT      Head:   Normocephalic and atraumatic.      Nose:   No congestion/rhinnorhea.       Mouth/Throat:   MMM, no pharyngeal erythema. No peritonsillar mass.       Neck:   No meningismus. Full ROM. Hematological/Lymphatic/Immunilogical:   No cervical lymphadenopathy. Cardiovascular:   RRR. Symmetric bilateral radial and DP pulses.  No murmurs.  Respiratory:   Normal respiratory effort  without tachypnea/retractions. Breath sounds are clear and equal bilaterally. No wheezes/rales/rhonchi. Gastrointestinal:   Soft with epigastric tenderness. Non distended. There is no CVA tenderness.  No rebound, rigidity, or guarding.  Musculoskeletal:   Normal range of motion in all extremities. No joint effusions.  No lower extremity tenderness.  No edema. Neurologic:   Normal speech and language.  Motor grossly intact. No acute focal neurologic deficits are appreciated.  Skin:    Skin is warm, dry and intact. No rash noted.  No petechiae, purpura, or bullae.  ____________________________________________    LABS (pertinent positives/negatives) (all labs ordered are listed, but only abnormal results are displayed) Labs Reviewed  URINALYSIS, COMPLETE (UACMP) WITH MICROSCOPIC - Abnormal; Notable for the following components:      Result Value   Color, Urine YELLOW (*)    APPearance HAZY (*)    Bacteria, UA RARE (*)    All other components within normal limits  BASIC METABOLIC PANEL  CBC  PREGNANCY, URINE  HEPATIC FUNCTION PANEL  LIPASE, BLOOD  CBG MONITORING, ED  CBG MONITORING, ED   ____________________________________________   EKG    ____________________________________________    RADIOLOGY  Ct Abdomen Pelvis W Contrast  Result Date: 10/10/2017 CLINICAL DATA:  22 year old female with abdominal pain, nausea vomiting. EXAM: CT ABDOMEN AND PELVIS WITH CONTRAST TECHNIQUE: Multidetector CT imaging of the abdomen and pelvis was performed using the standard protocol following bolus administration of intravenous contrast. CONTRAST:  167mL ISOVUE-300 IOPAMIDOL (ISOVUE-300) INJECTION 61% COMPARISON:  Abdominal radiograph dated 10/09/2017 FINDINGS: Evaluation of this exam is limited due to respiratory motion artifact. Lower chest: The visualized lung bases are clear. No intra-abdominal free air or free fluid. Hepatobiliary: No focal liver abnormality is seen. No gallstones,  gallbladder wall thickening, or biliary dilatation. Pancreas: Unremarkable. No pancreatic ductal dilatation or surrounding inflammatory changes. Spleen: Normal in size without focal abnormality. Adrenals/Urinary Tract: Adrenal glands are unremarkable. Kidneys are normal, without renal calculi, focal lesion, or hydronephrosis. Bladder is unremarkable. Stomach/Bowel: Multiple normal caliber fluid-filled loops of small bowel in the upper abdomen with mild stranding of the mesentery may represent mild enteritis. Clinical correlation is recommended. There is no bowel obstruction. The appendix is normal. Vascular/Lymphatic: No significant vascular findings are present. No enlarged abdominal or pelvic lymph nodes. Reproductive: The uterus is anteverted appears unremarkable. An intrauterine device is noted which appears in appropriate positioning. There is a 7 cm cyst in the posterior pelvis superior and posterior to the uterus which appears to arise from the right ovary. The left ovary is unremarkable. Other: None Musculoskeletal: No acute or significant osseous findings. IMPRESSION: 1. Findings may represent enteritis. Correlation with clinical exam recommended. No bowel obstruction. Normal appendix. 2. A 7 cm right ovarian cyst. This cyst may be difficult  to assess completely with ultrasound due to large size. Further evaluation with pelvic MRI on a nonemergent basis recommended. Electronically Signed   By: Anner Crete M.D.   On: 10/10/2017 02:36    ____________________________________________   PROCEDURES Procedures  ____________________________________________  DIFFERENTIAL DIAGNOSIS   Bowel perforation, obstruction, peptic ulcer disease, pancreatitis, pancreatic mass  CLINICAL IMPRESSION / ASSESSMENT AND PLAN / ED COURSE  Pertinent labs & imaging results that were available during my care of the patient were reviewed by me and considered in my medical decision making (see chart for details).     Patient well-appearing, vital signs normal, initial labs unremarkable.  I will add on LFTs and lipase.  Check a CT abdomen pelvis due to her tenderness and inability to provide a detailed history or even a reliable physical exam.    ----------------------------------------- 3:07 AM on 10/10/2017 -----------------------------------------  CT suggestive of enteritis, no acute pathology, no tumors.  She does have a right ovarian cysts, which I informed the mother about.  Stable for discharge home.  Repeat blood sugar was 80.  Vital signs remain normal and patient is calm comfortable and at baseline.      ____________________________________________   FINAL CLINICAL IMPRESSION(S) / ED DIAGNOSES    Final diagnoses:  Abdominal pain, unspecified abdominal location  Right ovarian cyst     ED Discharge Orders        Ordered    ondansetron (ZOFRAN ODT) 4 MG disintegrating tablet  Every 8 hours PRN     10/10/17 0306      Portions of this note were generated with dragon dictation software. Dictation errors may occur despite best attempts at proofreading.    Carrie Mew, MD 10/10/17 [phone]

## 2017-10-10 NOTE — Discharge Instructions (Signed)
Results for orders placed or performed during the hospital encounter of 93/26/71  Basic metabolic panel  Result Value Ref Range   Sodium 139 135 - 145 mmol/L   Potassium 3.6 3.5 - 5.1 mmol/L   Chloride 105 98 - 111 mmol/L   CO2 27 22 - 32 mmol/L   Glucose, Bld 76 70 - 99 mg/dL   BUN 14 6 - 20 mg/dL   Creatinine, Ser 0.66 0.44 - 1.00 mg/dL   Calcium 8.9 8.9 - 10.3 mg/dL   GFR calc non Af Amer >60 >60 mL/min   GFR calc Af Amer >60 >60 mL/min   Anion gap 7 5 - 15  CBC  Result Value Ref Range   WBC 9.8 3.6 - 11.0 K/uL   RBC 4.78 3.80 - 5.20 MIL/uL   Hemoglobin 13.5 12.0 - 16.0 g/dL   HCT 39.9 35.0 - 47.0 %   MCV 83.5 80.0 - 100.0 fL   MCH 28.2 26.0 - 34.0 pg   MCHC 33.8 32.0 - 36.0 g/dL   RDW 13.6 11.5 - 14.5 %   Platelets 338 150 - 440 K/uL  Urinalysis, Complete w Microscopic  Result Value Ref Range   Color, Urine YELLOW (A) YELLOW   APPearance HAZY (A) CLEAR   Specific Gravity, Urine 1.016 1.005 - 1.030   pH 6.0 5.0 - 8.0   Glucose, UA NEGATIVE NEGATIVE mg/dL   Hgb urine dipstick NEGATIVE NEGATIVE   Bilirubin Urine NEGATIVE NEGATIVE   Ketones, ur NEGATIVE NEGATIVE mg/dL   Protein, ur NEGATIVE NEGATIVE mg/dL   Nitrite NEGATIVE NEGATIVE   Leukocytes, UA NEGATIVE NEGATIVE   RBC / HPF 0-5 0 - 5 RBC/hpf   WBC, UA 0-5 0 - 5 WBC/hpf   Bacteria, UA RARE (A) NONE SEEN   Squamous Epithelial / LPF 6-10 0 - 5   Mucus PRESENT   Pregnancy, urine  Result Value Ref Range   Preg Test, Ur NEGATIVE NEGATIVE  Hepatic function panel  Result Value Ref Range   Total Protein 7.4 6.5 - 8.1 g/dL   Albumin 3.9 3.5 - 5.0 g/dL   AST 20 15 - 41 U/L   ALT 21 0 - 44 U/L   Alkaline Phosphatase 106 38 - 126 U/L   Total Bilirubin 0.5 0.3 - 1.2 mg/dL   Bilirubin, Direct <0.1 0.0 - 0.2 mg/dL   Indirect Bilirubin NOT CALCULATED 0.3 - 0.9 mg/dL  Lipase, blood  Result Value Ref Range   Lipase 39 11 - 51 U/L   Dg Ankle Complete Right  Result Date: 10/03/2017 CLINICAL DATA:  Right ankle injury.  EXAM: RIGHT ANKLE - COMPLETE 3+ VIEW COMPARISON:  08/05/2013. FINDINGS: No acute bony or joint abnormality. Diffuse osteopenia. Postsurgical changes appear stable. Hardware intact. No soft tissue foreign bodies. IMPRESSION: Stable postsurgical changes. Hardware intact. No acute bony or joint abnormality. Diffuse osteopenia. No acute bony abnormality. Electronically Signed   By: Marcello Moores  Register   On: 10/03/2017 08:10   Ct Abdomen Pelvis W Contrast  Result Date: 10/10/2017 CLINICAL DATA:  22 year old female with abdominal pain, nausea vomiting. EXAM: CT ABDOMEN AND PELVIS WITH CONTRAST TECHNIQUE: Multidetector CT imaging of the abdomen and pelvis was performed using the standard protocol following bolus administration of intravenous contrast. CONTRAST:  135mL ISOVUE-300 IOPAMIDOL (ISOVUE-300) INJECTION 61% COMPARISON:  Abdominal radiograph dated 10/09/2017 FINDINGS: Evaluation of this exam is limited due to respiratory motion artifact. Lower chest: The visualized lung bases are clear. No intra-abdominal free air or free fluid. Hepatobiliary: No focal  liver abnormality is seen. No gallstones, gallbladder wall thickening, or biliary dilatation. Pancreas: Unremarkable. No pancreatic ductal dilatation or surrounding inflammatory changes. Spleen: Normal in size without focal abnormality. Adrenals/Urinary Tract: Adrenal glands are unremarkable. Kidneys are normal, without renal calculi, focal lesion, or hydronephrosis. Bladder is unremarkable. Stomach/Bowel: Multiple normal caliber fluid-filled loops of small bowel in the upper abdomen with mild stranding of the mesentery may represent mild enteritis. Clinical correlation is recommended. There is no bowel obstruction. The appendix is normal. Vascular/Lymphatic: No significant vascular findings are present. No enlarged abdominal or pelvic lymph nodes. Reproductive: The uterus is anteverted appears unremarkable. An intrauterine device is noted which appears in  appropriate positioning. There is a 7 cm cyst in the posterior pelvis superior and posterior to the uterus which appears to arise from the right ovary. The left ovary is unremarkable. Other: None Musculoskeletal: No acute or significant osseous findings. IMPRESSION: 1. Findings may represent enteritis. Correlation with clinical exam recommended. No bowel obstruction. Normal appendix. 2. A 7 cm right ovarian cyst. This cyst may be difficult to assess completely with ultrasound due to large size. Further evaluation with pelvic MRI on a nonemergent basis recommended. Electronically Signed   By: Anner Crete M.D.   On: 10/10/2017 02:36   Dg Abd 2 Views  Result Date: 10/03/2017 CLINICAL DATA:  Constipation EXAM: ABDOMEN - 2 VIEW COMPARISON:  07/04/2015 FINDINGS: IUD noted in the pelvis. Large stool burden in the rectosigmoid colon. Nonobstructive bowel gas pattern. No organomegaly or free air. No acute bony abnormality. IMPRESSION: Large stool burden in the rectosigmoid colon.  No acute findings. Electronically Signed   By: Rolm Baptise M.D.   On: 10/03/2017 08:09

## 2017-10-11 ENCOUNTER — Ambulatory Visit: Payer: BC Managed Care – PPO | Admitting: Anesthesiology

## 2017-10-11 ENCOUNTER — Encounter
Admission: AD | Disposition: A | Discharge status: Home / Self Care | Payer: Self-pay | Source: Ambulatory Visit | Attending: Obstetrics & Gynecology

## 2017-10-11 ENCOUNTER — Other Ambulatory Visit: Payer: Self-pay

## 2017-10-11 ENCOUNTER — Encounter: Payer: Self-pay | Admitting: Certified Registered Nurse Anesthetist

## 2017-10-11 ENCOUNTER — Observation Stay
Admission: AD | Admit: 2017-10-11 | Discharge: 2017-10-12 | Disposition: A | Discharge status: Home / Self Care | Payer: BC Managed Care – PPO | Source: Ambulatory Visit | Attending: Obstetrics & Gynecology | Admitting: Obstetrics & Gynecology

## 2017-10-11 DIAGNOSIS — Z79899 Other long term (current) drug therapy: Secondary | ICD-10-CM | POA: Diagnosis not present

## 2017-10-11 DIAGNOSIS — Z885 Allergy status to narcotic agent status: Secondary | ICD-10-CM | POA: Insufficient documentation

## 2017-10-11 DIAGNOSIS — N9489 Other specified conditions associated with female genital organs and menstrual cycle: Secondary | ICD-10-CM | POA: Insufficient documentation

## 2017-10-11 DIAGNOSIS — K5900 Constipation, unspecified: Secondary | ICD-10-CM | POA: Diagnosis not present

## 2017-10-11 DIAGNOSIS — R102 Pelvic and perineal pain: Secondary | ICD-10-CM | POA: Insufficient documentation

## 2017-10-11 DIAGNOSIS — N83209 Unspecified ovarian cyst, unspecified side: Secondary | ICD-10-CM

## 2017-10-11 DIAGNOSIS — Q998 Other specified chromosome abnormalities: Secondary | ICD-10-CM | POA: Diagnosis not present

## 2017-10-11 DIAGNOSIS — D270 Benign neoplasm of right ovary: Secondary | ICD-10-CM | POA: Insufficient documentation

## 2017-10-11 DIAGNOSIS — K219 Gastro-esophageal reflux disease without esophagitis: Secondary | ICD-10-CM | POA: Insufficient documentation

## 2017-10-11 DIAGNOSIS — Z888 Allergy status to other drugs, medicaments and biological substances status: Secondary | ICD-10-CM | POA: Diagnosis not present

## 2017-10-11 DIAGNOSIS — Z9889 Other specified postprocedural states: Secondary | ICD-10-CM

## 2017-10-11 DIAGNOSIS — Z30433 Encounter for removal and reinsertion of intrauterine contraceptive device: Secondary | ICD-10-CM | POA: Diagnosis not present

## 2017-10-11 DIAGNOSIS — N838 Other noninflammatory disorders of ovary, fallopian tube and broad ligament: Secondary | ICD-10-CM | POA: Insufficient documentation

## 2017-10-11 DIAGNOSIS — N83201 Unspecified ovarian cyst, right side: Secondary | ICD-10-CM | POA: Diagnosis present

## 2017-10-11 HISTORY — PX: LAPAROSCOPIC OVARIAN CYSTECTOMY: SHX6248

## 2017-10-11 LAB — TYPE AND SCREEN
ABO/RH(D): A POS
ANTIBODY SCREEN: NEGATIVE

## 2017-10-11 SURGERY — EXCISION, CYST, OVARY, LAPAROSCOPIC
Anesthesia: General | Laterality: Right | Wound class: Clean Contaminated

## 2017-10-11 MED ORDER — ROCURONIUM BROMIDE 100 MG/10ML IV SOLN
INTRAVENOUS | Status: DC | PRN
  Administered 2017-10-11: 10 mg via INTRAVENOUS
  Administered 2017-10-11: 30 mg via INTRAVENOUS
  Administered 2017-10-11: 5 mg via INTRAVENOUS

## 2017-10-11 MED ORDER — FENTANYL CITRATE (PF) 100 MCG/2ML IJ SOLN
INTRAMUSCULAR | Status: AC
  Filled 2017-10-11: qty 2

## 2017-10-11 MED ORDER — DOCUSATE SODIUM 100 MG PO CAPS
100.0000 mg | ORAL_CAPSULE | Freq: Two times a day (BID) | ORAL | Status: DC
  Administered 2017-10-12: 100 mg via ORAL
  Filled 2017-10-11: qty 1

## 2017-10-11 MED ORDER — SODIUM CHLORIDE 0.9 % IV SOLN
50.0000 mL/h | INTRAVENOUS | Status: DC
  Administered 2017-10-11: 50 mL/h via INTRAVENOUS

## 2017-10-11 MED ORDER — SUGAMMADEX SODIUM 200 MG/2ML IV SOLN
INTRAVENOUS | Status: AC
  Filled 2017-10-11: qty 2

## 2017-10-11 MED ORDER — OXYCODONE HCL 5 MG PO TABS: 5.0000 mg | ORAL_TABLET | Freq: Once | ORAL | Status: DC | PRN

## 2017-10-11 MED ORDER — KETOROLAC TROMETHAMINE 30 MG/ML IJ SOLN: 30.0000 mg | Freq: Four times a day (QID) | INTRAMUSCULAR | Status: DC

## 2017-10-11 MED ORDER — DEXAMETHASONE SODIUM PHOSPHATE 10 MG/ML IJ SOLN
INTRAMUSCULAR | Status: AC
  Filled 2017-10-11: qty 1

## 2017-10-11 MED ORDER — LORAZEPAM 2 MG/ML IJ SOLN
1.0000 mg | INTRAMUSCULAR | Status: DC | PRN
  Administered 2017-10-11: 1 mg via INTRAVENOUS
  Filled 2017-10-11 (×3): qty 1

## 2017-10-11 MED ORDER — DEXMEDETOMIDINE HCL IN NACL 80 MCG/20ML IV SOLN
INTRAVENOUS | Status: AC
  Filled 2017-10-11: qty 20

## 2017-10-11 MED ORDER — ONDANSETRON HCL 4 MG PO TABS: 4.0000 mg | ORAL_TABLET | Freq: Four times a day (QID) | ORAL | Status: DC | PRN

## 2017-10-11 MED ORDER — IBUPROFEN 800 MG PO TABS: 800.0000 mg | ORAL_TABLET | Freq: Four times a day (QID) | ORAL | 0 refills | Status: AC

## 2017-10-11 MED ORDER — LACTATED RINGERS IV SOLN
INTRAVENOUS | Status: DC | PRN
  Administered 2017-10-11: 15:00:00 via INTRAVENOUS

## 2017-10-11 MED ORDER — SILVER NITRATE-POT NITRATE 75-25 % EX MISC
CUTANEOUS | Status: AC
  Filled 2017-10-11: qty 1

## 2017-10-11 MED ORDER — OXYCODONE HCL 5 MG PO TABS
5.0000 mg | ORAL_TABLET | ORAL | 0 refills | Status: AC | PRN
Start: 2017-10-11 — End: ?

## 2017-10-11 MED ORDER — SIMETHICONE 80 MG PO CHEW
160.0000 mg | CHEWABLE_TABLET | Freq: Four times a day (QID) | ORAL | Status: DC | PRN
  Filled 2017-10-11: qty 2

## 2017-10-11 MED ORDER — SUCCINYLCHOLINE CHLORIDE 20 MG/ML IJ SOLN
INTRAMUSCULAR | Status: DC | PRN
  Administered 2017-10-11: 160 mg via INTRAVENOUS

## 2017-10-11 MED ORDER — FENTANYL CITRATE (PF) 100 MCG/2ML IJ SOLN
50.0000 ug | INTRAMUSCULAR | Status: DC | PRN
Start: 2017-10-11 — End: 2017-10-12
  Administered 2017-10-11: 50 ug via INTRAVENOUS
  Filled 2017-10-11: qty 2

## 2017-10-11 MED ORDER — HYDROMORPHONE HCL 1 MG/ML IJ SOLN
0.2500 mg | INTRAMUSCULAR | Status: DC | PRN
  Administered 2017-10-11 (×2): 0.25 mg via INTRAVENOUS

## 2017-10-11 MED ORDER — KETOROLAC TROMETHAMINE 30 MG/ML IJ SOLN
30.0000 mg | Freq: Four times a day (QID) | INTRAMUSCULAR | Status: DC
  Administered 2017-10-11: 30 mg via INTRAVENOUS

## 2017-10-11 MED ORDER — PHENYLEPHRINE HCL 10 MG/ML IJ SOLN
INTRAMUSCULAR | Status: DC | PRN
  Administered 2017-10-11: 50 ug via INTRAVENOUS
  Administered 2017-10-11: 100 ug via INTRAVENOUS

## 2017-10-11 MED ORDER — PROPOFOL 10 MG/ML IV BOLUS
INTRAVENOUS | Status: AC
  Filled 2017-10-11: qty 20

## 2017-10-11 MED ORDER — DEXMEDETOMIDINE HCL 200 MCG/2ML IV SOLN
INTRAVENOUS | Status: DC | PRN
  Administered 2017-10-11: 20 ug via INTRAVENOUS

## 2017-10-11 MED ORDER — SODIUM CHLORIDE 0.9 % IV SOLN: 8.0000 mg | Freq: Three times a day (TID) | INTRAVENOUS | Status: DC

## 2017-10-11 MED ORDER — ACETAMINOPHEN 325 MG PO TABS: 650.0000 mg | ORAL_TABLET | ORAL | Status: DC | PRN

## 2017-10-11 MED ORDER — HYDROMORPHONE HCL 1 MG/ML IJ SOLN
INTRAMUSCULAR | Status: AC
  Administered 2017-10-11: 0.25 mg via INTRAVENOUS
  Filled 2017-10-11: qty 1

## 2017-10-11 MED ORDER — LACTATED RINGERS IV SOLN: INTRAVENOUS | Status: DC

## 2017-10-11 MED ORDER — KETOROLAC TROMETHAMINE 30 MG/ML IJ SOLN
30.0000 mg | Freq: Four times a day (QID) | INTRAMUSCULAR | Status: DC
  Administered 2017-10-11 – 2017-10-12 (×3): 30 mg via INTRAVENOUS
  Filled 2017-10-11 (×3): qty 1

## 2017-10-11 MED ORDER — MIDAZOLAM HCL 2 MG/2ML IJ SOLN
INTRAMUSCULAR | Status: AC
  Filled 2017-10-11: qty 2

## 2017-10-11 MED ORDER — SUGAMMADEX SODIUM 200 MG/2ML IV SOLN
INTRAVENOUS | Status: DC | PRN
  Administered 2017-10-11: 180 mg via INTRAVENOUS

## 2017-10-11 MED ORDER — FENTANYL CITRATE (PF) 100 MCG/2ML IJ SOLN
INTRAMUSCULAR | Status: DC | PRN
  Administered 2017-10-11 (×4): 50 ug via INTRAVENOUS

## 2017-10-11 MED ORDER — SUCCINYLCHOLINE CHLORIDE 20 MG/ML IJ SOLN
INTRAMUSCULAR | Status: AC
  Filled 2017-10-11: qty 1

## 2017-10-11 MED ORDER — FENTANYL CITRATE (PF) 100 MCG/2ML IJ SOLN
INTRAMUSCULAR | Status: AC
  Administered 2017-10-11: 50 ug via INTRAVENOUS
  Filled 2017-10-11: qty 2

## 2017-10-11 MED ORDER — FENTANYL CITRATE (PF) 100 MCG/2ML IJ SOLN
25.0000 ug | INTRAMUSCULAR | Status: AC | PRN
  Administered 2017-10-11: 25 ug via INTRAVENOUS
  Administered 2017-10-11: 50 ug via INTRAVENOUS
  Administered 2017-10-11 (×3): 25 ug via INTRAVENOUS
  Administered 2017-10-11: 50 ug via INTRAVENOUS

## 2017-10-11 MED ORDER — LIDOCAINE HCL (PF) 2 % IJ SOLN
INTRAMUSCULAR | Status: AC
  Filled 2017-10-11: qty 10

## 2017-10-11 MED ORDER — LACTATED RINGERS IV SOLN
Freq: Once | INTRAVENOUS | Status: AC
  Administered 2017-10-11: 14:00:00 via INTRAVENOUS

## 2017-10-11 MED ORDER — ONDANSETRON HCL 4 MG/2ML IJ SOLN
INTRAMUSCULAR | Status: DC | PRN
  Administered 2017-10-11: 4 mg via INTRAVENOUS

## 2017-10-11 MED ORDER — ACETAMINOPHEN 500 MG PO TABS
1000.0000 mg | ORAL_TABLET | Freq: Four times a day (QID) | ORAL | Status: DC
  Administered 2017-10-11 – 2017-10-12 (×3): 1000 mg via ORAL
  Filled 2017-10-11 (×3): qty 2

## 2017-10-11 MED ORDER — ACETAMINOPHEN 650 MG RE SUPP: 650.0000 mg | RECTAL | Status: DC | PRN

## 2017-10-11 MED ORDER — FENTANYL 25 MCG/HR TD PT72
25.0000 ug | MEDICATED_PATCH | TRANSDERMAL | Status: DC
  Administered 2017-10-11: 25 ug via TRANSDERMAL

## 2017-10-11 MED ORDER — ACETAMINOPHEN 10 MG/ML IV SOLN
INTRAVENOUS | Status: AC
  Filled 2017-10-11: qty 100

## 2017-10-11 MED ORDER — ROCURONIUM BROMIDE 50 MG/5ML IV SOLN
INTRAVENOUS | Status: AC
  Filled 2017-10-11: qty 1

## 2017-10-11 MED ORDER — LIDOCAINE HCL (CARDIAC) PF 100 MG/5ML IV SOSY
PREFILLED_SYRINGE | INTRAVENOUS | Status: DC | PRN
  Administered 2017-10-11: 20 mg via INTRAVENOUS
  Administered 2017-10-11: 80 mg via INTRAVENOUS

## 2017-10-11 MED ORDER — ONDANSETRON HCL 4 MG/2ML IJ SOLN
4.0000 mg | Freq: Four times a day (QID) | INTRAMUSCULAR | Status: DC | PRN
  Filled 2017-10-11 (×2): qty 2

## 2017-10-11 MED ORDER — KETOROLAC TROMETHAMINE 30 MG/ML IJ SOLN
INTRAMUSCULAR | Status: DC | PRN
  Administered 2017-10-11: 30 mg via INTRAVENOUS

## 2017-10-11 MED ORDER — HYDROMORPHONE HCL 1 MG/ML IJ SOLN: 0.2000 mg | INTRAMUSCULAR | Status: DC | PRN

## 2017-10-11 MED ORDER — SODIUM CHLORIDE 0.9 % IV SOLN
8.0000 mg | Freq: Three times a day (TID) | INTRAVENOUS | Status: DC
  Filled 2017-10-11 (×3): qty 4

## 2017-10-11 MED ORDER — PROPOFOL 10 MG/ML IV BOLUS
INTRAVENOUS | Status: DC | PRN
  Administered 2017-10-11: 200 mg via INTRAVENOUS

## 2017-10-11 MED ORDER — OXYCODONE HCL 5 MG/5ML PO SOLN: 5.0000 mg | Freq: Once | ORAL | Status: DC | PRN

## 2017-10-11 MED ORDER — ONDANSETRON HCL 4 MG/2ML IJ SOLN
4.0000 mg | INTRAMUSCULAR | Status: DC
  Administered 2017-10-11 – 2017-10-12 (×3): 4 mg via INTRAVENOUS
  Filled 2017-10-11 (×3): qty 2

## 2017-10-11 MED ORDER — SILVER NITRATE-POT NITRATE 75-25 % EX MISC
CUTANEOUS | Status: DC | PRN
  Administered 2017-10-11: 1

## 2017-10-11 MED ORDER — ONDANSETRON HCL 4 MG PO TABS: 4.0000 mg | ORAL_TABLET | Freq: Three times a day (TID) | ORAL | Status: DC

## 2017-10-11 MED ORDER — MENTHOL 3 MG MT LOZG
1.0000 | LOZENGE | OROMUCOSAL | Status: DC | PRN
  Filled 2017-10-11: qty 9

## 2017-10-11 MED ORDER — ONDANSETRON HCL 4 MG/2ML IJ SOLN
INTRAMUSCULAR | Status: AC
  Filled 2017-10-11: qty 2

## 2017-10-11 MED ORDER — ACETAMINOPHEN 10 MG/ML IV SOLN
INTRAVENOUS | Status: DC | PRN
  Administered 2017-10-11: 1000 mg via INTRAVENOUS

## 2017-10-11 MED ORDER — DEXAMETHASONE SODIUM PHOSPHATE 10 MG/ML IJ SOLN
INTRAMUSCULAR | Status: DC | PRN
  Administered 2017-10-11: 8 mg via INTRAVENOUS

## 2017-10-11 SURGICAL SUPPLY — 56 items
ANCHOR TIS RET SYS 235ML (MISCELLANEOUS) IMPLANT
BACTOSHIELD CHG 4% 4OZ (MISCELLANEOUS) ×1
BAG URINE DRAINAGE (UROLOGICAL SUPPLIES) ×3 IMPLANT
BLADE SURG SZ11 CARB STEEL (BLADE) ×3 IMPLANT
BNDG COHESIVE 4X5 TAN STRL (GAUZE/BANDAGES/DRESSINGS) ×3 IMPLANT
CANISTER SUCT 1200ML W/VALVE (MISCELLANEOUS) ×3 IMPLANT
CATH FOLEY 2WAY  5CC 16FR (CATHETERS) ×2
CATH URTH 16FR FL 2W BLN LF (CATHETERS) ×1 IMPLANT
CHLORAPREP W/TINT 26ML (MISCELLANEOUS) ×3 IMPLANT
CNTNR SPEC 2.5X3XGRAD LEK (MISCELLANEOUS) ×1
CONT SPEC 4OZ STER OR WHT (MISCELLANEOUS) ×2
CONTAINER SPEC 2.5X3XGRAD LEK (MISCELLANEOUS) ×1 IMPLANT
DERMABOND ADVANCED (GAUZE/BANDAGES/DRESSINGS) ×2
DERMABOND ADVANCED .7 DNX12 (GAUZE/BANDAGES/DRESSINGS) ×1 IMPLANT
DEVICE SUTURE ENDOST 10MM (ENDOMECHANICALS) IMPLANT
DEVICE TROCAR PUNCTURE CLOSURE (ENDOMECHANICALS) IMPLANT
DRAPE LEGGINS SURG 28X43 STRL (DRAPES) ×3 IMPLANT
DRAPE SHEET LG 3/4 BI-LAMINATE (DRAPES) ×3 IMPLANT
DRAPE UNDER BUTTOCK W/FLU (DRAPES) ×3 IMPLANT
GLOVE BIOGEL PI IND STRL 7.5 (GLOVE) ×1 IMPLANT
GLOVE BIOGEL PI INDICATOR 7.5 (GLOVE) ×2
GLOVE PI ORTHOPRO 6.5 (GLOVE) ×2
GLOVE PI ORTHOPRO STRL 6.5 (GLOVE) ×1 IMPLANT
GLOVE SURG SYN 6.5 ES PF (GLOVE) ×12 IMPLANT
GLOVE SURG SYN 7.0 (GLOVE) ×3 IMPLANT
GOWN STRL REUS W/ TWL LRG LVL3 (GOWN DISPOSABLE) ×2 IMPLANT
GOWN STRL REUS W/TWL LRG LVL3 (GOWN DISPOSABLE) ×4
GRASPER SUT TROCAR 14GX15 (MISCELLANEOUS) ×3 IMPLANT
IRRIGATION STRYKERFLOW (MISCELLANEOUS) ×1 IMPLANT
IRRIGATOR STRYKERFLOW (MISCELLANEOUS) ×3
IV LACTATED RINGERS 1000ML (IV SOLUTION) ×3 IMPLANT
KIT PINK PAD W/HEAD ARE REST (MISCELLANEOUS) ×3
KIT PINK PAD W/HEAD ARM REST (MISCELLANEOUS) ×1 IMPLANT
KIT TURNOVER CYSTO (KITS) ×3 IMPLANT
L-HOOK LAP DISP 36CM (ELECTROSURGICAL) ×3
LHOOK LAP DISP 36CM (ELECTROSURGICAL) ×1 IMPLANT
LIGASURE VESSEL 5MM BLUNT TIP (ELECTROSURGICAL) ×3 IMPLANT
MANIPULATOR UTERINE 4.5 ZUMI (MISCELLANEOUS) IMPLANT
NS IRRIG 500ML POUR BTL (IV SOLUTION) ×3 IMPLANT
PACK LAP CHOLECYSTECTOMY (MISCELLANEOUS) ×3 IMPLANT
PAD OB MATERNITY 4.3X12.25 (PERSONAL CARE ITEMS) ×3 IMPLANT
PAD PREP 24X41 OB/GYN DISP (PERSONAL CARE ITEMS) ×3 IMPLANT
PENCIL ELECTRO HAND CTR (MISCELLANEOUS) ×3 IMPLANT
POUCH SPECIMEN RETRIEVAL 10MM (ENDOMECHANICALS) ×3 IMPLANT
SCISSORS METZENBAUM CVD 33 (INSTRUMENTS) IMPLANT
SCRUB CHG 4% DYNA-HEX 4OZ (MISCELLANEOUS) ×2 IMPLANT
SLEEVE ENDOPATH XCEL 5M (ENDOMECHANICALS) ×6 IMPLANT
SPONGE XRAY 4X4 16PLY STRL (MISCELLANEOUS) ×3 IMPLANT
SUT ENDO VLOC 180-0-8IN (SUTURE) IMPLANT
SUT MNCRL AB 4-0 PS2 18 (SUTURE) ×3 IMPLANT
SUT VIC AB 2-0 UR6 27 (SUTURE) ×3 IMPLANT
SUT VICRYL 3-0 (SUTURE) ×3 IMPLANT
SYR 10ML LL (SYRINGE) ×3 IMPLANT
TROCAR ENDO BLADELESS 11MM (ENDOMECHANICALS) ×3 IMPLANT
TROCAR XCEL NON-BLD 5MMX100MML (ENDOMECHANICALS) ×3 IMPLANT
TUBING INSUFFLATION (TUBING) ×3 IMPLANT

## 2017-10-11 NOTE — OR Nursing (Signed)
Liletta intrauterine system was inserted by Dr. Leonides Schanz that she brought from her office . She also removed an intrauterine device.

## 2017-10-11 NOTE — Transfer of Care (Signed)
Immediate Anesthesia Transfer of Care Note  Patient: Ariel Powell  Procedure(s) Performed: LAPAROSCOPIC Right papatubal CYSTECTOMY (Right )  Patient Location: PACU  Anesthesia Type:General  Level of Consciousness: sedated and responds to stimulation  Airway & Oxygen Therapy: Patient Spontanous Breathing and Patient connected to face mask oxygen  Post-op Assessment: Report given to RN and Post -op Vital signs reviewed and stable  Post vital signs: Reviewed and stable  Last Vitals:  Vitals Value Taken Time  BP 114/65 10/11/2017  4:44 PM  Temp    Pulse 93 10/11/2017  4:44 PM  Resp 19 10/11/2017  4:44 PM  SpO2 100 % 10/11/2017  4:44 PM    Last Pain:  Vitals:   10/11/17 1404  TempSrc: Temporal  PainSc: 0-No pain         Complications: No apparent anesthesia complications

## 2017-10-11 NOTE — H&P (Signed)
Preoperative History and Physical  Ariel Powell is a 22 y.o. G0 here for surgical management of acute intermittent pain, 7cm ovarian/adnexal cyst.   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  Was seen in ED overnight due to this pain on 6/26 and told to follow up with her primary care physician, who sent her to see me today, urgently.  Given her imaging findings and acute change, ovarian torsion cannot be ruled out.  Proposed surgery: operative laparoscopy; ovarian cystectomy possible oophorectomy  Past Medical History:  Diagnosis Date  . Allergy   . Constipation   . GERD (gastroesophageal reflux disease)   . Hopkins Syndrome [facility])   . Pitt-Hopkins syndrome    History reviewed. No pertinent surgical history. OB History  No data available  Patient denies any other pertinent gynecologic issues.   No current facility-administered medications on file prior to encounter.    Current Outpatient Medications on File Prior to Encounter  Medication Sig Dispense Refill  . lansoprazole (PREVACID) 30 MG capsule Take by mouth.    . levonorgestrel (MIRENA) 20 MCG/24HR IUD by Intrauterine route.    . ondansetron (ZOFRAN ODT) 4 MG disintegrating tablet Take 1 tablet (4 mg total) by mouth every 8 (eight) hours as needed for nausea or vomiting. 20 tablet 0  . polyethylene glycol powder (GLYCOLAX/MIRALAX) powder Take by mouth.     Allergies  Allergen Reactions  . Morphine And Related     itchy  . Phenergan [Promethazine Hcl]     Suppository; has psychotic reaction    Social History:   reports that she has never smoked. She has never used smokeless tobacco. She reports that she does not drink alcohol or use drugs.  Family history: No gyn cancers  Review of Systems: Noncontributory  PHYSICAL EXAM: There were no vitals taken for this visit. General appearance - alert, well appearing, and in no  distress Chest - clear to auscultation, no wheezes, rales or rhonchi, symmetric air entry Heart - normal rate and regular rhythm Abdomen - soft, nontender, nondistended, no masses or organomegaly Pelvic - examination deferred Extremities - peripheral pulses normal, no pedal edema, no clubbing or cyanosis  Labs: Results for orders placed or performed during the hospital encounter of 10/09/17 (from the past 336 hour(s))  Hepatic function panel   Collection Time: 10/09/17 12:30 AM  Result Value Ref Range   Total Protein 7.4 6.5 - 8.1 g/dL   Albumin 3.9 3.5 - 5.0 g/dL   AST 20 15 - 41 U/L   ALT 21 0 - 44 U/L   Alkaline Phosphatase 106 38 - 126 U/L   Total Bilirubin 0.5 0.3 - 1.2 mg/dL   Bilirubin, Direct <0.1 0.0 - 0.2 mg/dL   Indirect Bilirubin NOT CALCULATED 0.3 - 0.9 mg/dL  Lipase, blood   Collection Time: 10/09/17 12:30 AM  Result Value Ref Range   Lipase 39 11 - 51 U/L  Basic metabolic panel   Collection Time: 10/09/17  7:26 PM  Result Value Ref Range   Sodium 139 135 - 145 mmol/L   Potassium 3.6 3.5 - 5.1 mmol/L   Chloride 105 98 - 111 mmol/L   CO2 27 22 - 32 mmol/L   Glucose, Bld 76 70 - 99 mg/dL   BUN 14 6 - 20 mg/dL   Creatinine, Ser 0.66 0.44 - 1.00 mg/dL   Calcium 8.9 8.9 - 10.3 mg/dL   GFR calc  non Af Amer >60 >60 mL/min   GFR calc Af Amer >60 >60 mL/min   Anion gap 7 5 - 15  CBC   Collection Time: 10/09/17  7:26 PM  Result Value Ref Range   WBC 9.8 3.6 - 11.0 K/uL   RBC 4.78 3.80 - 5.20 MIL/uL   Hemoglobin 13.5 12.0 - 16.0 g/dL   HCT 39.9 35.0 - 47.0 %   MCV 83.5 80.0 - 100.0 fL   MCH 28.2 26.0 - 34.0 pg   MCHC 33.8 32.0 - 36.0 g/dL   RDW 13.6 11.5 - 14.5 %   Platelets 338 150 - 440 K/uL  Urinalysis, Complete w Microscopic   Collection Time: 10/09/17  7:26 PM  Result Value Ref Range   Color, Urine YELLOW (A) YELLOW   APPearance HAZY (A) CLEAR   Specific Gravity, Urine 1.016 1.005 - 1.030   pH 6.0 5.0 - 8.0   Glucose, UA NEGATIVE NEGATIVE mg/dL   Hgb  urine dipstick NEGATIVE NEGATIVE   Bilirubin Urine NEGATIVE NEGATIVE   Ketones, ur NEGATIVE NEGATIVE mg/dL   Protein, ur NEGATIVE NEGATIVE mg/dL   Nitrite NEGATIVE NEGATIVE   Leukocytes, UA NEGATIVE NEGATIVE   RBC / HPF 0-5 0 - 5 RBC/hpf   WBC, UA 0-5 0 - 5 WBC/hpf   Bacteria, UA RARE (A) NONE SEEN   Squamous Epithelial / LPF 6-10 0 - 5   Mucus PRESENT   Pregnancy, urine   Collection Time: 10/09/17 11:24 PM  Result Value Ref Range   Preg Test, Ur NEGATIVE NEGATIVE  Glucose, capillary   Collection Time: 10/10/17 12:39 AM  Result Value Ref Range   Glucose-Capillary 81 70 - 99 mg/dL    Imaging Studies: Dg Ankle Complete Right  Result Date: 10/03/2017 CLINICAL DATA:  Right ankle injury. EXAM: RIGHT ANKLE - COMPLETE 3+ VIEW COMPARISON:  08/05/2013. FINDINGS: No acute bony or joint abnormality. Diffuse osteopenia. Postsurgical changes appear stable. Hardware intact. No soft tissue foreign bodies. IMPRESSION: Stable postsurgical changes. Hardware intact. No acute bony or joint abnormality. Diffuse osteopenia. No acute bony abnormality. Electronically Signed   By: Marcello Moores  Register   On: 10/03/2017 08:10   Dg Abd 1 View  Result Date: 10/10/2017 CLINICAL DATA:  Abdominal pain, evaluate stool burden EXAM: ABDOMEN - 1 VIEW COMPARISON:  KUB of 10/02/2017 FINDINGS: There is a moderate amount of feces remaining throughout the entire colon. No bowel obstruction is seen. IUD is present within the pelvis. No opaque calculi are seen. IMPRESSION: Moderate amount of feces remain scattered throughout the entire colon. Electronically Signed   By: Ivar Drape M.D.   On: 10/10/2017 09:19   Ct Abdomen Pelvis W Contrast  Result Date: 10/10/2017 CLINICAL DATA:  22 year old female with abdominal pain, nausea vomiting. EXAM: CT ABDOMEN AND PELVIS WITH CONTRAST TECHNIQUE: Multidetector CT imaging of the abdomen and pelvis was performed using the standard protocol following bolus administration of intravenous  contrast. CONTRAST:  168mL ISOVUE-300 IOPAMIDOL (ISOVUE-300) INJECTION 61% COMPARISON:  Abdominal radiograph dated 10/09/2017 FINDINGS: Evaluation of this exam is limited due to respiratory motion artifact. Lower chest: The visualized lung bases are clear. No intra-abdominal free air or free fluid. Hepatobiliary: No focal liver abnormality is seen. No gallstones, gallbladder wall thickening, or biliary dilatation. Pancreas: Unremarkable. No pancreatic ductal dilatation or surrounding inflammatory changes. Spleen: Normal in size without focal abnormality. Adrenals/Urinary Tract: Adrenal glands are unremarkable. Kidneys are normal, without renal calculi, focal lesion, or hydronephrosis. Bladder is unremarkable. Stomach/Bowel: Multiple normal caliber fluid-filled loops  of small bowel in the upper abdomen with mild stranding of the mesentery may represent mild enteritis. Clinical correlation is recommended. There is no bowel obstruction. The appendix is normal. Vascular/Lymphatic: No significant vascular findings are present. No enlarged abdominal or pelvic lymph nodes. Reproductive: The uterus is anteverted appears unremarkable. An intrauterine device is noted which appears in appropriate positioning. There is a 7 cm cyst in the posterior pelvis superior and posterior to the uterus which appears to arise from the right ovary. The left ovary is unremarkable. Other: None Musculoskeletal: No acute or significant osseous findings. IMPRESSION: 1. Findings may represent enteritis. Correlation with clinical exam recommended. No bowel obstruction. Normal appendix. 2. A 7 cm right ovarian cyst. This cyst may be difficult to assess completely with ultrasound due to large size. Further evaluation with pelvic MRI on a nonemergent basis recommended. Electronically Signed   By: Anner Crete M.D.   On: 10/10/2017 02:36   Dg Abd 2 Views  Result Date: 10/03/2017 CLINICAL DATA:  Constipation EXAM: ABDOMEN - 2 VIEW COMPARISON:   07/04/2015 FINDINGS: IUD noted in the pelvis. Large stool burden in the rectosigmoid colon. Nonobstructive bowel gas pattern. No organomegaly or free air. No acute bony abnormality. IMPRESSION: Large stool burden in the rectosigmoid colon.  No acute findings. Electronically Signed   By: Rolm Baptise M.D.   On: 10/03/2017 08:09    Assessment: Patient Active Problem List   Diagnosis Date Noted  . Ovarian cyst 10/11/2017    Plan: Patient will undergo surgical management with laparoscopic ovarian cystectomy or oophorectomy.   The risks of surgery were discussed in detail with the patient including but not limited to: bleeding which may require transfusion or reoperation; infection which may require antibiotics; injury to surrounding organs which may involve bowel, bladder, ureters ; need for additional procedures including laparoscopy or laparotomy; thromboembolic phenomenon, surgical site problems and other postoperative/anesthesia complications. Likelihood of success in alleviating the patient's condition was discussed. Routine postoperative instructions will be reviewed with the patient and her family in detail after surgery.  The patient concurred with the proposed plan, giving informed written consent for the surgery.  Patient has been NPO since last night she will remain NPO for procedure.  Anesthesia and OR aware.  To OR when ready.  ----- Larey Days, MD Attending Obstetrician and Gynecologist [facility], Department of [facility]

## 2017-10-11 NOTE — Anesthesia Post-op Follow-up Note (Signed)
Anesthesia QCDR form completed.        

## 2017-10-11 NOTE — Anesthesia Preprocedure Evaluation (Addendum)
Anesthesia Evaluation  Patient identified by MRN, date of birth, ID band Patient awake    Reviewed: Allergy & Precautions, H&P , NPO status , Patient's Chart, lab work & pertinent test results  History of Anesthesia Complications Negative for: history of anesthetic complications  Airway Mallampati: II  TM Distance: >3 FB Neck ROM: full    Dental  (+) Chipped, Poor Dentition   Pulmonary neg pulmonary ROS, neg shortness of breath,           Cardiovascular Exercise Tolerance: Good (-) Past MI negative cardio ROS       Neuro/Psych neg Seizures PSYCHIATRIC DISORDERS negative neurological ROS     GI/Hepatic Neg liver ROS, GERD  Medicated and Controlled,  Endo/Other  negative endocrine ROS  Renal/GU      Musculoskeletal   Abdominal   Peds  Hematology negative hematology ROS (+)   Anesthesia Other Findings Past Medical History: No date: Allergy No date: Constipation No date: GERD (gastroesophageal reflux disease) 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 No pertinent surgical history.     Reproductive/Obstetrics negative OB ROS                             Anesthesia Physical Anesthesia Plan  ASA: III and emergent  Anesthesia Plan: General ETT, Rapid Sequence and Cricoid Pressure   Post-op Pain Management:    Induction: Intravenous  PONV Risk Score and Plan: Ondansetron, Dexamethasone and Treatment may vary due to age or medical condition  Airway Management Planned: Oral ETT  Additional Equipment:   Intra-op Plan:   Post-operative Plan: Extubation in OR  Informed Consent: I have reviewed the patients History and Physical, chart, labs and discussed the procedure including the risks, benefits and alternatives for the proposed anesthesia with the patient or authorized representative who has indicated his/her understanding and acceptance.    Dental Advisory Given  Plan Discussed with: Anesthesiologist, CRNA and Surgeon  Anesthesia Plan Comments: (Patient last ate at 1100.  Dr. Leonides Schanz declares this case an emergency to protect the patients ovary.    Mother consented for risks of anesthesia including but not limited to:  - adverse reactions to medications - damage to teeth, lips or other oral mucosa - sore throat or hoarseness - Damage to heart, brain, lungs or loss of life  Mother voiced understanding.)      Anesthesia Quick Evaluation

## 2017-10-11 NOTE — Anesthesia Procedure Notes (Addendum)

## 2017-10-11 NOTE — Discharge Instructions (Signed)
Discharge instructions:  Call office if you have any of the following: fever >101 F, chills, excessive vaginal bleeding, incision drainage or problems, leg pain or redness, or any other concerns.   Activity: Do not lift > 15 lbs for 4 weeks.  No driving for 1-2 weeks.   You may feel some pain in your upper right abdomen/rib and right shoulder.  This is from the gas in the abdomen for surgery. This will subside over time, please be patient!  Take 600mg  Ibuprofen and 1000mg  Tylenol around the clock, every 6 hours for at least the first 3-5 days.  After this you can take as needed.  This will help decrease inflammation and promote healing.  The narcotics you'll take just as needed, as they just trick your brain into thinking its not in pain.    Please don't limit yourself in terms of routine activity.  You will be able to do most things, although they may take longer to do or be a little painful.  You can do it!  Don't be a hero, but don't be a wimp either!   AMBULATORY SURGERY  DISCHARGE INSTRUCTIONS   1) The drugs that you were given will stay in your system until tomorrow so for the next 24 hours you should not:  A) Drive an automobile B) Make any legal decisions C) Drink any alcoholic beverage   2) You may resume regular meals tomorrow.  Today it is better to start with liquids and gradually work up to solid foods.  You may eat anything you prefer, but it is better to start with liquids, then soup and crackers, and gradually work up to solid foods.   3) Please notify your doctor immediately if you have any unusual bleeding, trouble breathing, redness and pain at the surgery site, drainage, fever, or pain not relieved by medication.    4) Additional Instructions:    Please contact your physician with any problems or Same Day Surgery at [phone], Monday through Friday 6 am to 4 pm, or [place] at [facility] number at [phone].

## 2017-10-11 NOTE — Progress Notes (Signed)
Patient uncomfortable and agitated after surgery, discussed with family, would prefer to keep her overnight to make the transition home less abrupt.  Will give PRN anti-emetics, ativan. She is agitated with dilaudid, but ok with fentanyl.   Due to inability to self-administer PCA, ordered fentanyl patch.   Hopefully will get her calm and able to go home tomorrow.  ----- Larey Days, MD Attending Obstetrician and Gynecologist [facility], Department of [facility]

## 2017-10-11 NOTE — Anesthesia Postprocedure Evaluation (Signed)
Anesthesia Post Note  Patient: Ariel Powell  Procedure(s) Performed: LAPAROSCOPIC Right papatubal CYSTECTOMY (Right )  Patient location during evaluation: PACU Anesthesia Type: General Level of consciousness: awake and alert and oriented Pain management: pain level controlled Vital Signs Assessment: post-procedure vital signs reviewed and stable Respiratory status: spontaneous breathing Cardiovascular status: blood pressure returned to baseline Anesthetic complications: no     Last Vitals:  Vitals:   10/11/17 1942 10/11/17 2027  BP: 119/79 124/80  Pulse: 99 100  Resp: 18 20  Temp: 36.4 C   SpO2: 99% 100%    Last Pain:  Vitals:   10/11/17 1942  TempSrc:   PainSc: Asleep                 Magda Muise

## 2017-10-11 NOTE — Anesthesia Procedure Notes (Deleted)
Procedure Name: Intubation Date/Time: 10/11/2017 2:46 PM Performed by: Eben Burow, CRNA Pre-anesthesia Checklist: Patient identified, Emergency Drugs available, Suction available, Patient being monitored and Timeout performed Patient Re-evaluated:Patient Re-evaluated prior to induction Oxygen Delivery Method: Circle system utilized Preoxygenation: Pre-oxygenation with 100% oxygen Induction Type: Rapid sequence, Cricoid Pressure applied and Combination inhalational/ intravenous induction Laryngoscope Size: Miller and 2 Grade View: Grade I Tube type: Oral Tube size: 7.0 mm Number of attempts: 1 Airway Equipment and Method: Stylet Placement Confirmation: ETT inserted through vocal cords under direct vision,  positive ETCO2 and breath sounds checked- equal and bilateral Secured at: 21 cm Tube secured with: Tape Dental Injury: Teeth and Oropharynx as per pre-operative assessment

## 2017-10-11 NOTE — Op Note (Signed)
Ariel Powell PROCEDURE DATE: 10/11/2017  PATIENT:  Ariel Powell  22 y.o. female  PRE-OPERATIVE DIAGNOSIS:  Ovarian Cyst, acute pelvic pain, possible torsion, desired pregnancy prevention  POST-OPERATIVE DIAGNOSIS:  Paratubal vs Ovarian Cyst, acute pelvic pain, desired pregnancy prevention  PROCEDURE:  Procedure(s): LAPAROSCOPIC Right papatubal CYSTECTOMY (Right)  Removal and reinsertion of IUD  SURGEON:  Surgeon(s) and Role:    * Abdel Effinger, Honor Loh, MD - Primary  ANESTHESIA:  General via ET  I/O  Total I/O In: 600 [I.V.:600] Out: 310 [Urine:300; Blood:10]  FINDINGS:  Small cervix with IUD strings in place.  Small uterus, normal left tube and ovary. Normal upper abdomen. RIGHT ovary and tube involving a large pelvic-occupying cystic structure, unable to determine if paratubal or ovarian in origin.  SPECIMEN: RIGHT paratubal cyst and right fallopian tube. Pelvic washings  COMPLICATIONS: none apparent  DISPOSITION: vital signs stable to PACU   Indication for Surgery: 22 y.o. G0 with acute onset intermittent pelvic pain and CT showing 7cm mass.  Risks of surgery were discussed with the patient including but not limited to: bleeding which may require transfusion or reoperation; infection which may require antibiotics; injury to bowel, bladder, ureters or other surrounding organs; need for additional procedures including laparotomy, blood clot, incisional problems and other postoperative/anesthesia complications. Written informed consent was obtained.     PROCEDURE IN DETAIL:  The patient had sequential compression devices applied to her lower extremities while in the preoperative area.  She was then taken to the operating room where general anesthesia was administered and was found to be adequate.  She was placed in the dorsal lithotomy position, and was prepped and draped in a sterile manner.  A Foley catheter was inserted into her bladder and attached to constant  drainage.  The bivalved speculum was inserted and the IUD strings were in place.  They were grasped with a ringed forceps and the IUD was removed without difficulty.  A uterine manipulator was then advanced into the uterus .  After a surgical timeout was performed, attention was turned to the abdomen where an umbilical incision was made with the scalpel. An 67mm trochar was inserted in the umbilical incision using a visiport method. Opening pressure was 19mmHg, and the abdomen was insufflated to 30mmHg carbon dioxide gas and adequate pneumoperitoneum was obtained.  A survey of the patient's pelvis and abdomen revealed the findings as mentioned above. Two 74mm ports were inserted in the lower left and right quadrants under visualization.    An attempt was made using the bovie hook to isolate the tissues surrounding the large cyst, but the mesosalpinx was entirely involved, as was the fallopian tube draped over the cystic structure. An unintentional puncture was made through the thin walls, and the cystic fluid was drained using the suction/irrigator.  Once the cyst had been deflated, it was clear there was no area that could be salvaged around the fallopian tube, and the decision was made to transect the tissue at the divide from the ovary.  The fallopian tube and cyst were placed in an endocatch bag and brought through the 73mm port site without difficulty.    The operative site was surveyed, and it was found to be hemostatic.  No intraoperative injury to surrounding organs was noted. The 12mm port site was closed with the inlet closure device, using 2-0- vicryl.  The abdomen was desufflated and all instruments were then removed.  All skin incisions were closed with 4-0 monocryl and covered with surgical  glue.  The attention was turned back to the vagina, where the uterine manipulator was removed without complications.  The Liletta device was inserted into the cervix to the fundus of the uterus (7.5cm) and  deployed without difficulty.  The strings were cut to 3cm from the cervix.  The tenaculum site bled, and after silver nitrate applied, a 3-0 vicryl was placed for hemostasis.  The speculum was then removed.  The patient tolerated the procedures well.  All instruments, needles, and sponge counts were correct x 2. The patient was taken to the recovery room in stable condition.   ---- Larey Days, MD Attending Obstetrician and [facility]

## 2017-10-12 ENCOUNTER — Encounter: Payer: Self-pay | Admitting: Obstetrics & Gynecology

## 2017-10-12 DIAGNOSIS — Z30433 Encounter for removal and reinsertion of intrauterine contraceptive device: Secondary | ICD-10-CM | POA: Diagnosis not present

## 2017-10-12 LAB — CBC
HCT: 37.4 % (ref 35.0–47.0)
Hemoglobin: 12.7 g/dL (ref 12.0–16.0)
MCH: 28.3 pg (ref 26.0–34.0)
MCHC: 33.9 g/dL (ref 32.0–36.0)
MCV: 83.5 fL (ref 80.0–100.0)
PLATELETS: 319 10*3/uL (ref 150–440)
RBC: 4.48 MIL/uL (ref 3.80–5.20)
RDW: 13.1 % (ref 11.5–14.5)
WBC: 11.4 10*3/uL — ABNORMAL HIGH (ref 3.6–11.0)

## 2017-10-12 LAB — BASIC METABOLIC PANEL
Anion gap: 9 (ref 5–15)
BUN: 10 mg/dL (ref 6–20)
CALCIUM: 8.8 mg/dL — AB (ref 8.9–10.3)
CO2: 22 mmol/L (ref 22–32)
CREATININE: 0.66 mg/dL (ref 0.44–1.00)
Chloride: 107 mmol/L (ref 98–111)
Glucose, Bld: 115 mg/dL — ABNORMAL HIGH (ref 70–99)
Potassium: 4.1 mmol/L (ref 3.5–5.1)
SODIUM: 138 mmol/L (ref 135–145)

## 2017-10-12 MED ORDER — IBUPROFEN 600 MG PO TABS: 600.0000 mg | ORAL_TABLET | Freq: Four times a day (QID) | ORAL | Status: DC

## 2017-10-12 NOTE — Progress Notes (Signed)
Patient discharged home. Discharge instructions, prescriptions and follow up appointment given to and reviewed with patients dad (caretaker).  Dad verbalized understanding.

## 2017-10-12 NOTE — Progress Notes (Signed)
Educated dad (caretaker) on fentanyl patch. Intructed that per Dr. Leonides Schanz it is OK for patient to go home with fentanyl patch on arm. Patch is good for 72 hours, so family will remove patch on 7/1 at 2100. Do not give patient narcotics at home while fentanyl patch is in place.   Dad verbalized understanding, stated he did not plan on giving lizzie the narcotic at all anyway's.

## 2017-10-15 LAB — CYTOLOGY - NON PAP

## 2017-10-15 LAB — SURGICAL PATHOLOGY

## 2017-10-15 NOTE — Discharge Summary (Signed)
Gynecology Physician Postoperative Discharge Summary  Patient ID: Ariel Powell MRN: [id number] DOB/AGE: 07-13-1995 22 y.o.  Admit Date: 10/11/2017 Discharge Date: 10/15/2017  Preoperative Diagnoses: pelvic pain, ovarian cyst Postop diagnoses: same, right adnexal cyst.  Procedures: Procedure(s) (LRB): LAPAROSCOPIC Right papatubal CYSTECTOMY (Right)  CBC Latest Ref Rng & Units 10/12/2017 10/09/2017  WBC 3.6 - 11.0 K/uL 11.4(H) 9.8  Hemoglobin 12.0 - 16.0 g/dL 12.7 13.5  Hematocrit 35.0 - 47.0 % 37.4 39.9  Platelets 150 - 440 K/uL 319 338    Hospital Course:  Ariel Powell is a 22 y.o. admitted for surgery, emergently, as ovarian torsion was highly suspected.  She underwent the procedures as mentioned above, her operation was uncomplicated, and her adnexa was not torsed at the time of surgery, however it could have been intermittently. For further details about surgery, please refer to the operative report. Patient had an uncomplicated postoperative course, but due to her cognitive limitations and pain control, she was kept overnight and given a fentanyl patch.   By time of discharge on POD#1, her pain was controlled on oral pain medications; she was ambulating, voiding without difficulty, tolerating regular diet and passing flatus. She was deemed stable for discharge to home.   Discharge Exam: Blood pressure 105/76, pulse (!) 112, temperature 98.5 F (36.9 C), temperature source Oral, resp. rate 17, height 5\' 5"  (1.651 m), weight 87.5 kg (193 lb), SpO2 94 %. General appearance: alert and no distress  Resp: clear to auscultation bilaterally, normal respiratory effort Cardio: regular rate and rhythm  GI: soft, non-tender; bowel sounds normal; no masses, no organomegaly.  Incisions: C/D/I, no erythema, no drainage noted Pelvic: deferred Extremities: extremities normal, atraumatic, no cyanosis or edema and Homans sign is negative, no sign of DVT  Discharged Condition:  Stable  Disposition:   Discharge Instructions    Diet - low sodium heart healthy   Complete by:  As directed    Increase activity slowly   Complete by:  As directed      Allergies as of 10/12/2017      Reactions   Morphine And Related    itchy   Phenergan [promethazine Hcl]    Suppository; has psychotic reaction      Medication List    STOP taking these medications   levonorgestrel 20 MCG/24HR IUD Commonly known as:  MIRENA     TAKE these medications   ibuprofen 800 MG tablet Commonly known as:  ADVIL,MOTRIN Take 1 tablet (800 mg total) by mouth every 6 (six) hours.   lansoprazole 30 MG capsule Commonly known as:  PREVACID Take by mouth.   ondansetron 4 MG disintegrating tablet Commonly known as:  ZOFRAN ODT Take 1 tablet (4 mg total) by mouth every 8 (eight) hours as needed for nausea or vomiting.   oxyCODONE 5 MG immediate release tablet Commonly known as:  Oxy IR/ROXICODONE Take 1 tablet (5 mg total) by mouth every 4 (four) hours as needed (for pain score of 1-4).   polyethylene glycol powder powder Commonly known as:  GLYCOLAX/MIRALAX Take by mouth.      Follow-up Information    Oprah Camarena, Honor Loh, MD. Schedule an appointment as soon as possible for a visit in 2 week(s).   Specialty:  Obstetrics and Gynecology Why:  please call and schedule a 2 week follow up visit  Contact information: [address] [id number] [phone]           Signed:  [place] Attending Obstetrician &  [facility]

## 2018-02-19 IMAGING — CR DG FOOT COMPLETE 3+V*L*
1 series · 3 of 3 positions shown · non-contrast
Comparison: Left ankle radiographs 02/24/2014.

CLINICAL DATA: Reason the left foot.  Previous fusion.

EXAM:
LEFT FOOT - COMPLETE 3+ VIEW

[Series 1: x foot lat left · 0.14mm/px · 3 of 3 slices shown]
[im 1/3]
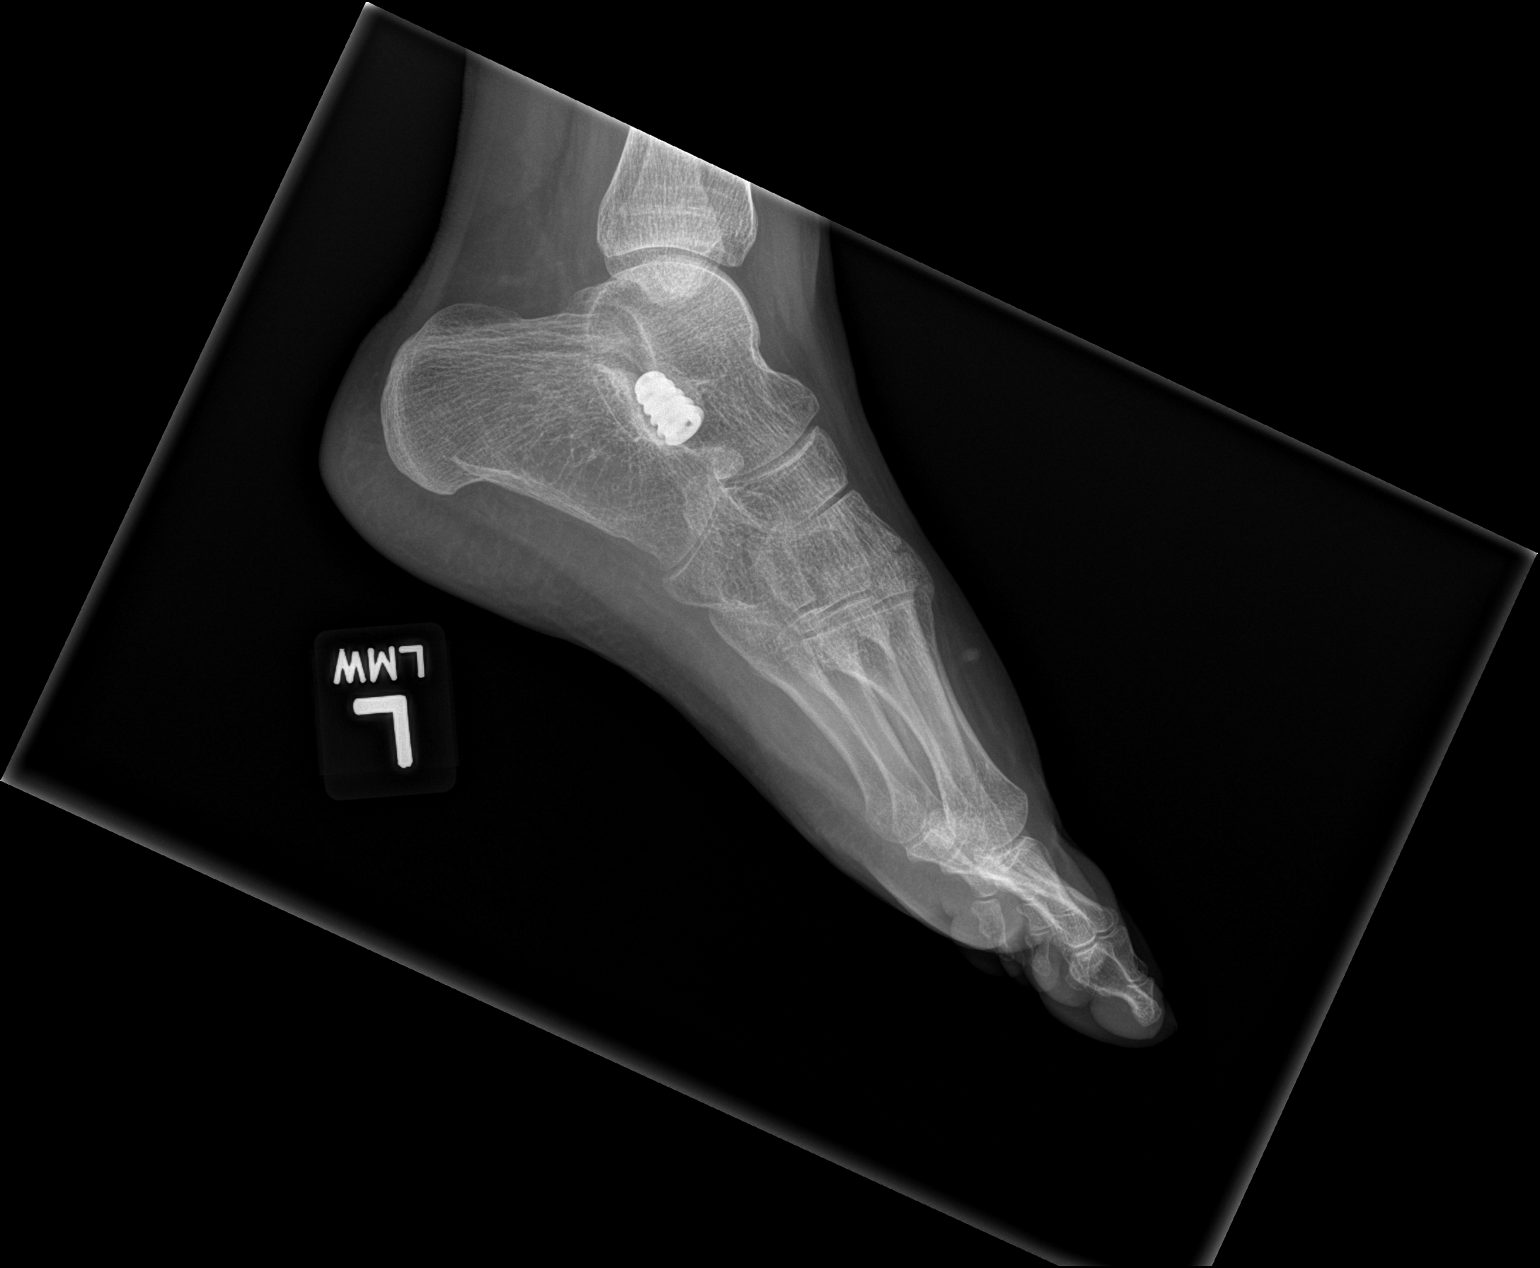
[im 2/3]
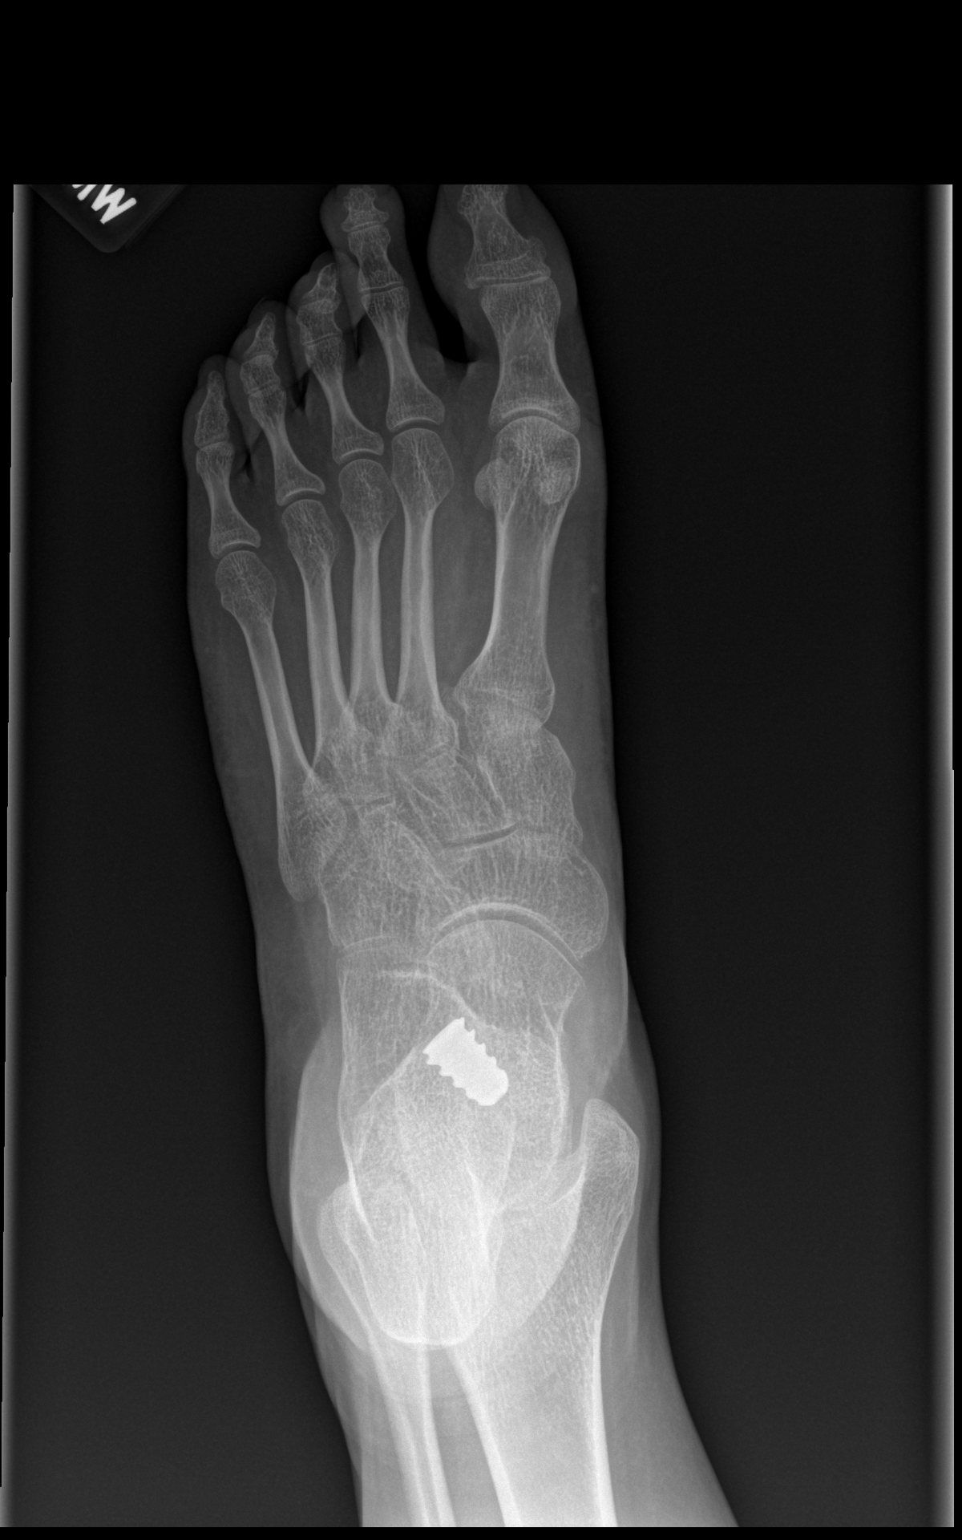
[im 3/3]
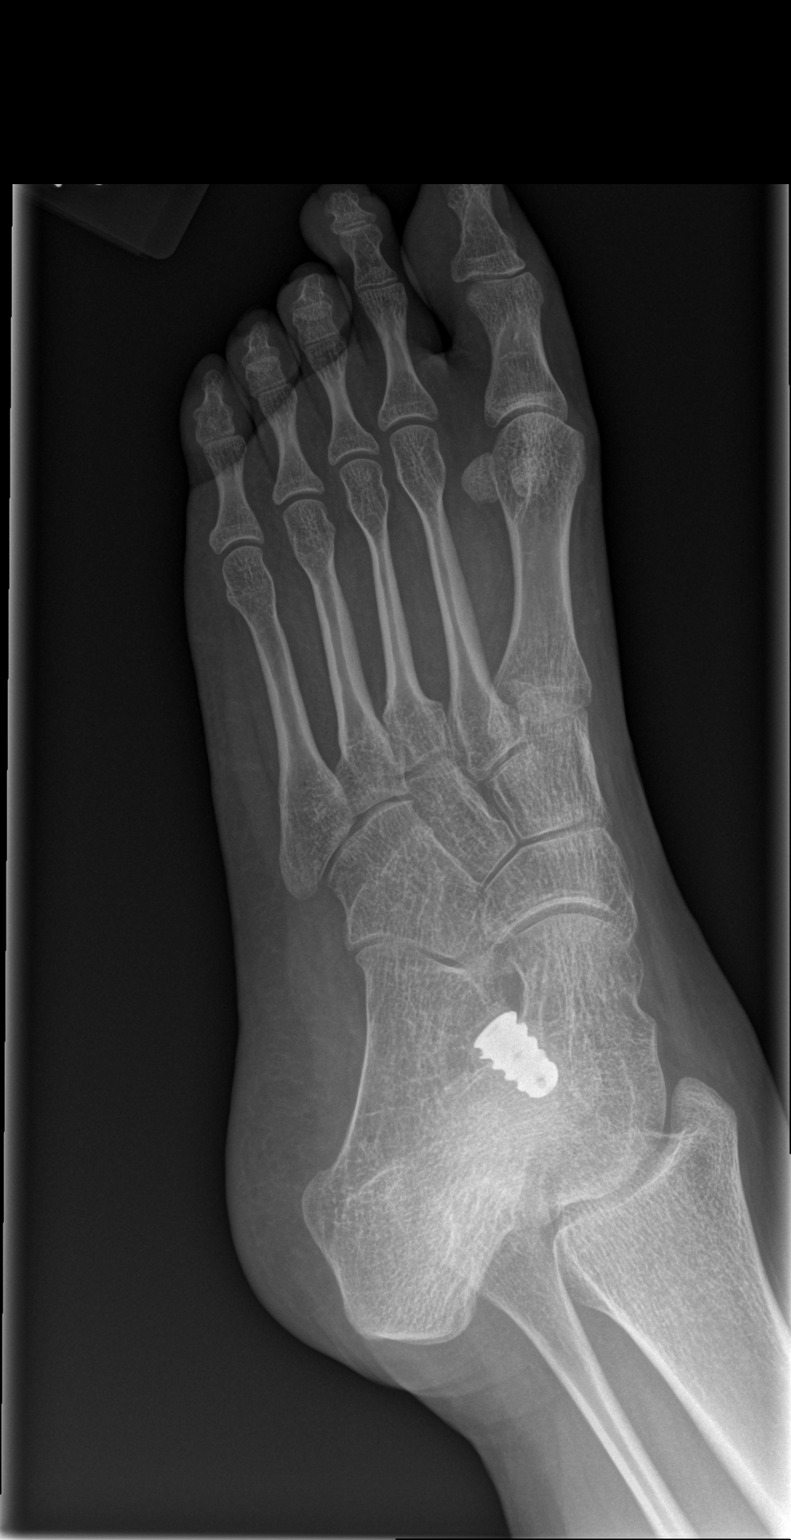

[3 of 3 positions shown; findings below may reference images not displayed]

FINDINGS: Talocalcaneal fusion is again noted. No acute bone or soft tissue
abnormalities are present.
IMPRESSION: No acute abnormality.

## 2018-02-19 IMAGING — CR DG ANKLE COMPLETE 3+V*L*
1 series · 3 of 3 positions shown · non-contrast
Comparison: Radiographs February 24, 2014.

CLINICAL DATA: Left foot pain.

EXAM:
LEFT ANKLE COMPLETE - 3+ VIEW

[Series 1: x ankle ap left · 0.14mm/px · 3 of 3 slices shown]
[im 1/3]
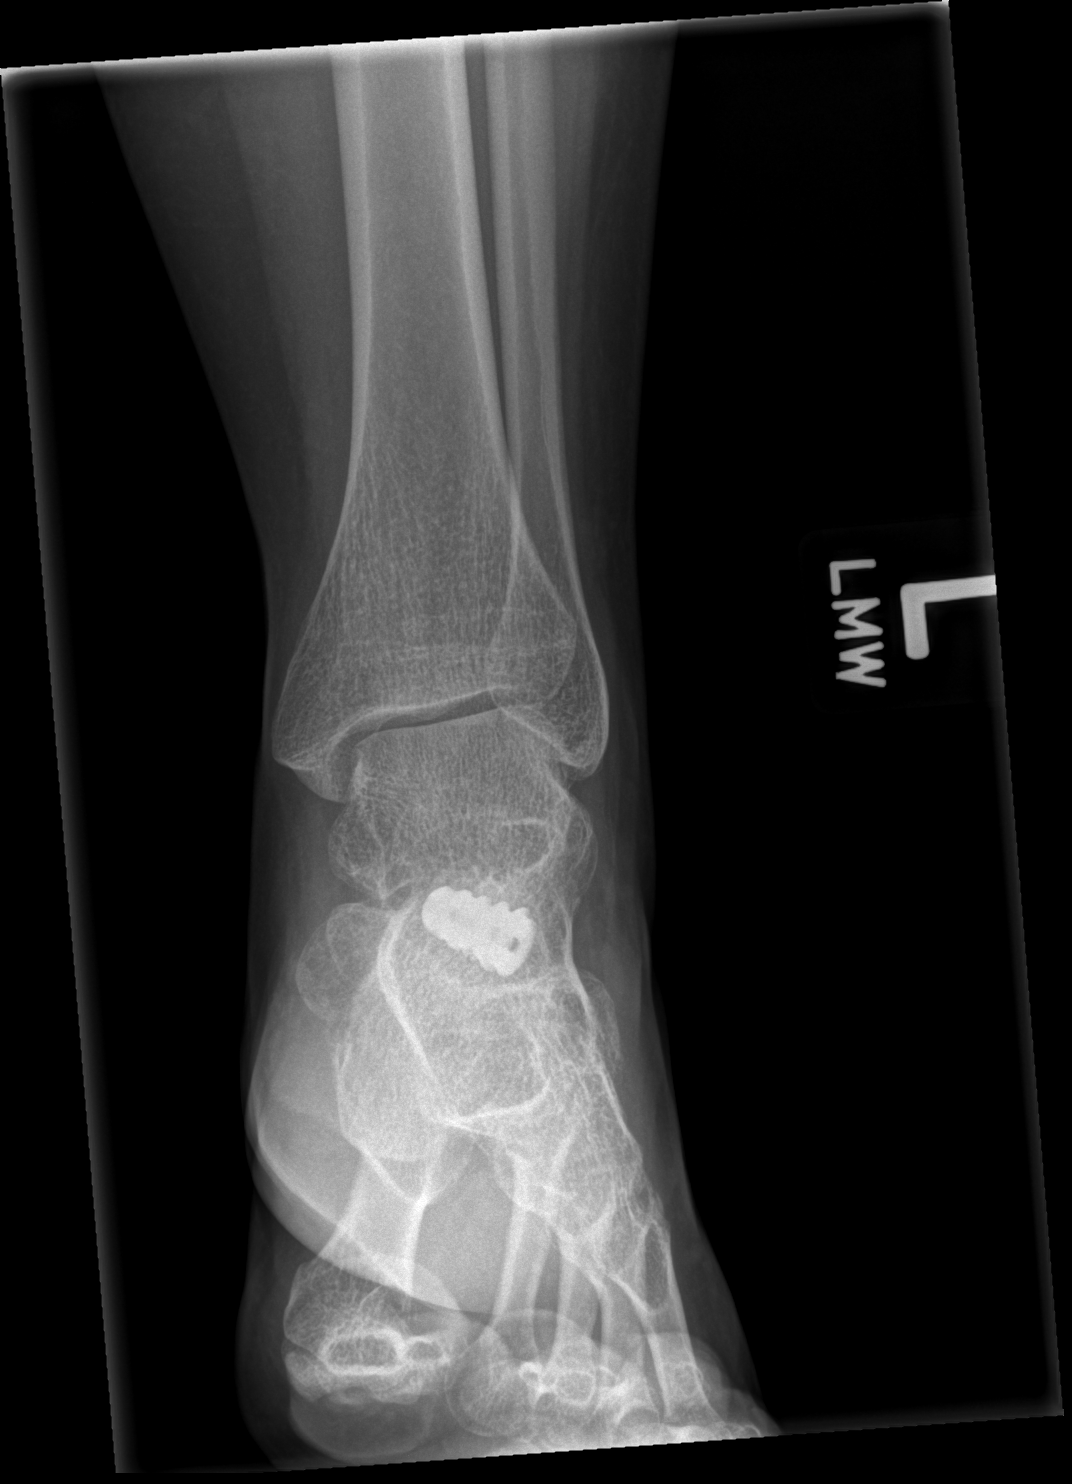
[im 2/3]
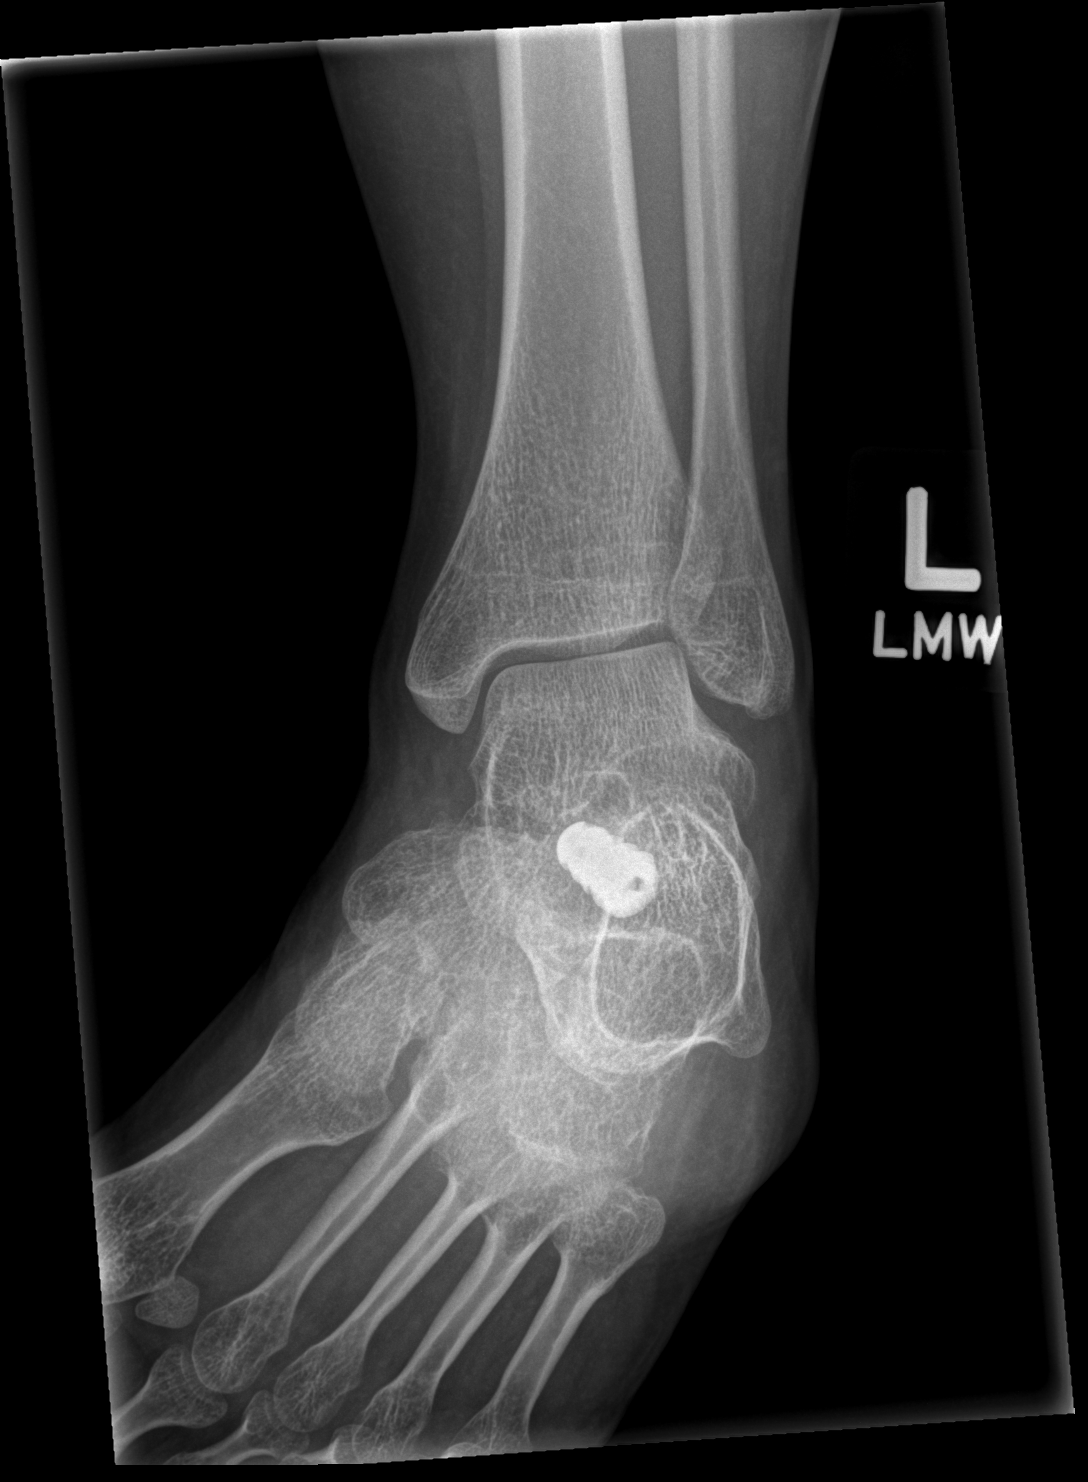
[im 3/3]
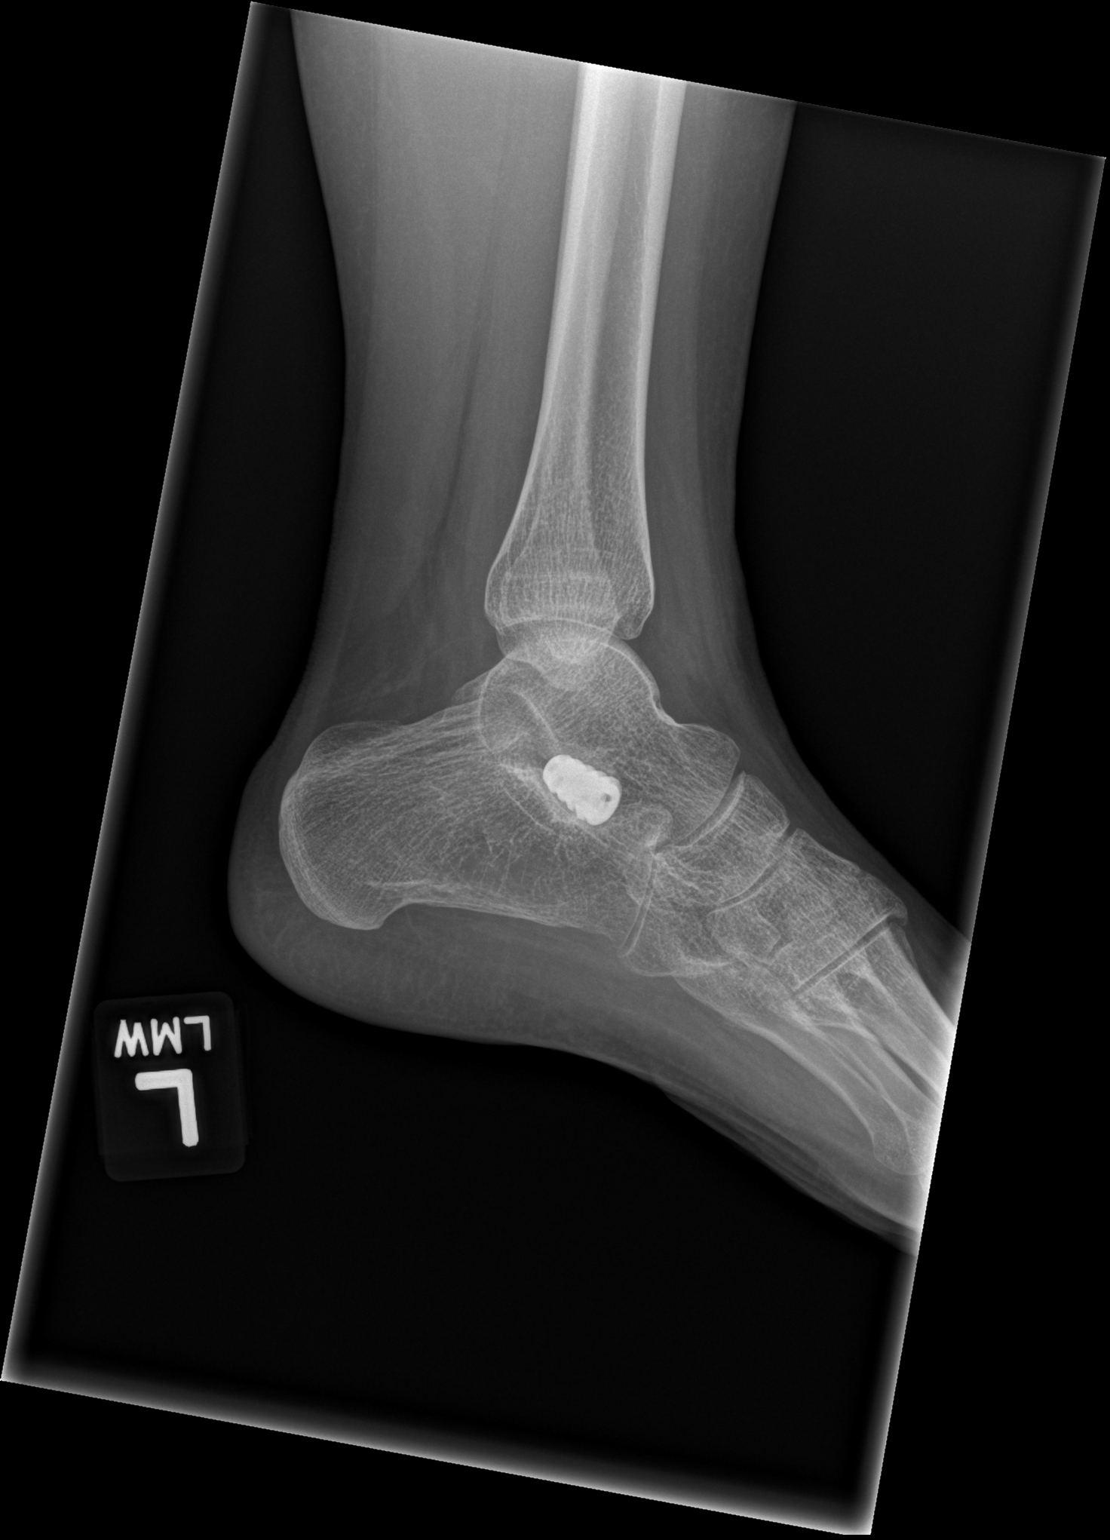

[3 of 3 positions shown; findings below may reference images not displayed]

FINDINGS: There is no evidence of fracture, dislocation, or joint effusion.
Status post surgical fusion of the talocalcaneal joint. Soft tissues
are unremarkable.
IMPRESSION: No acute abnormality seen in the left ankle.

## 2019-04-28 IMAGING — CR DG ABDOMEN 2V
1 series · 3 of 3 positions shown · non-contrast
Comparison: 07/04/2015

CLINICAL DATA: Constipation

EXAM:
ABDOMEN - 2 VIEW

[Series 1: dg abd 2 views · 0.14mm/px · 3 of 3 slices shown]
[im 1/3]
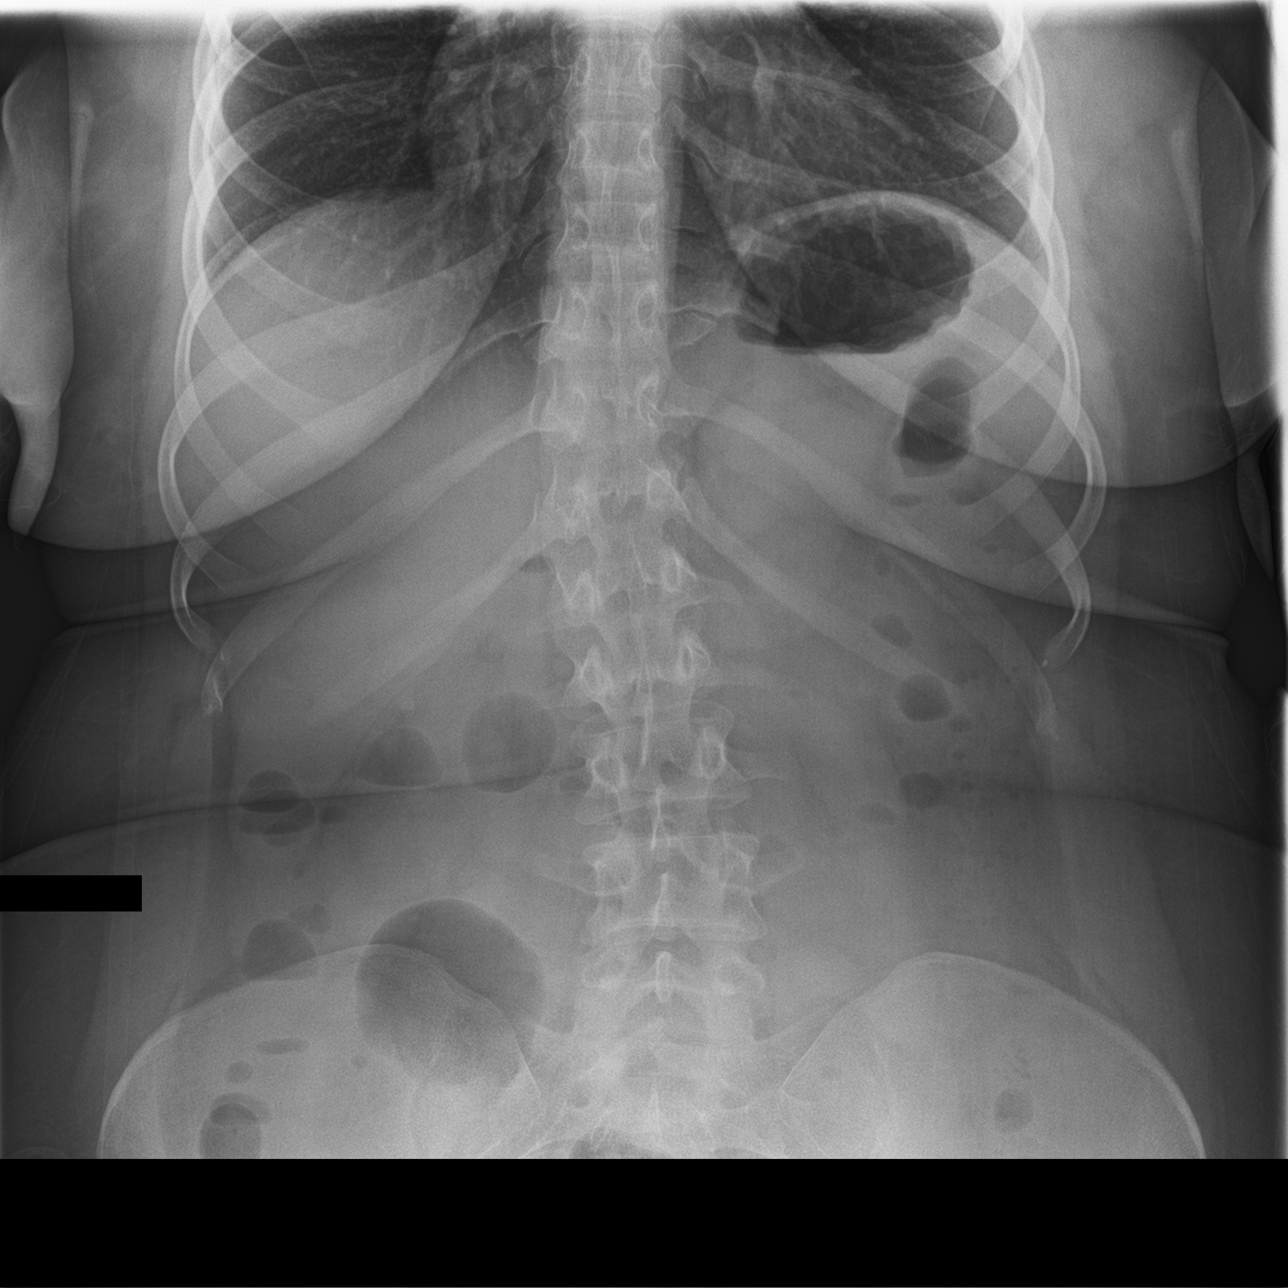
[im 2/3]
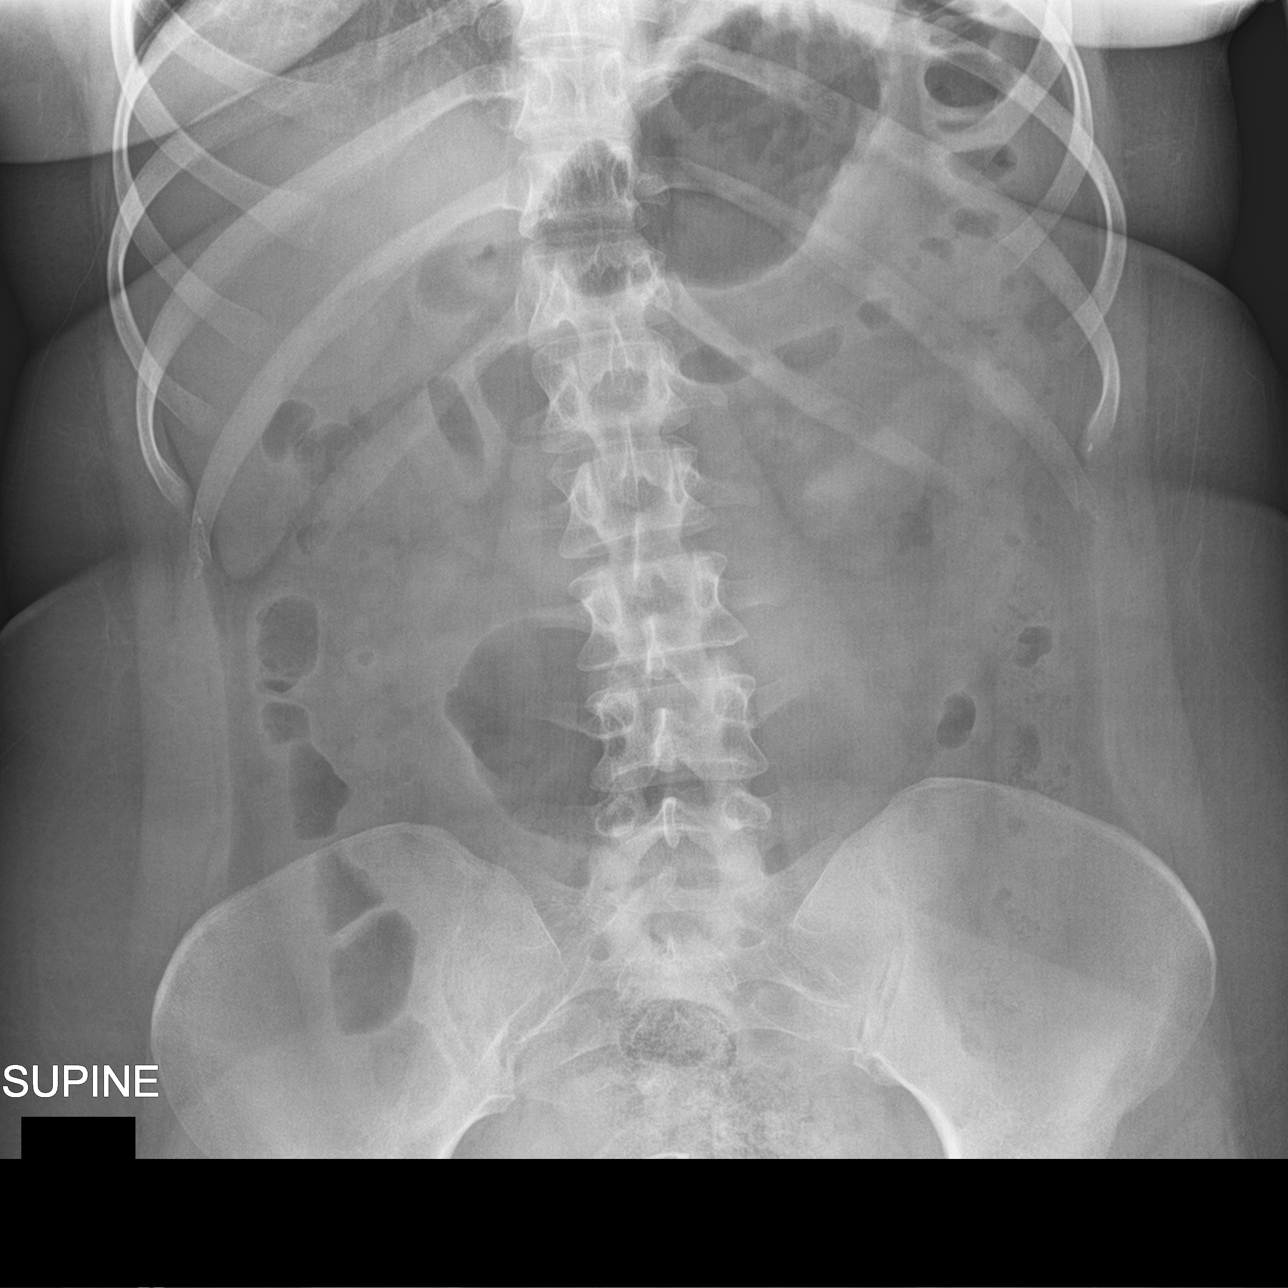
[im 3/3]
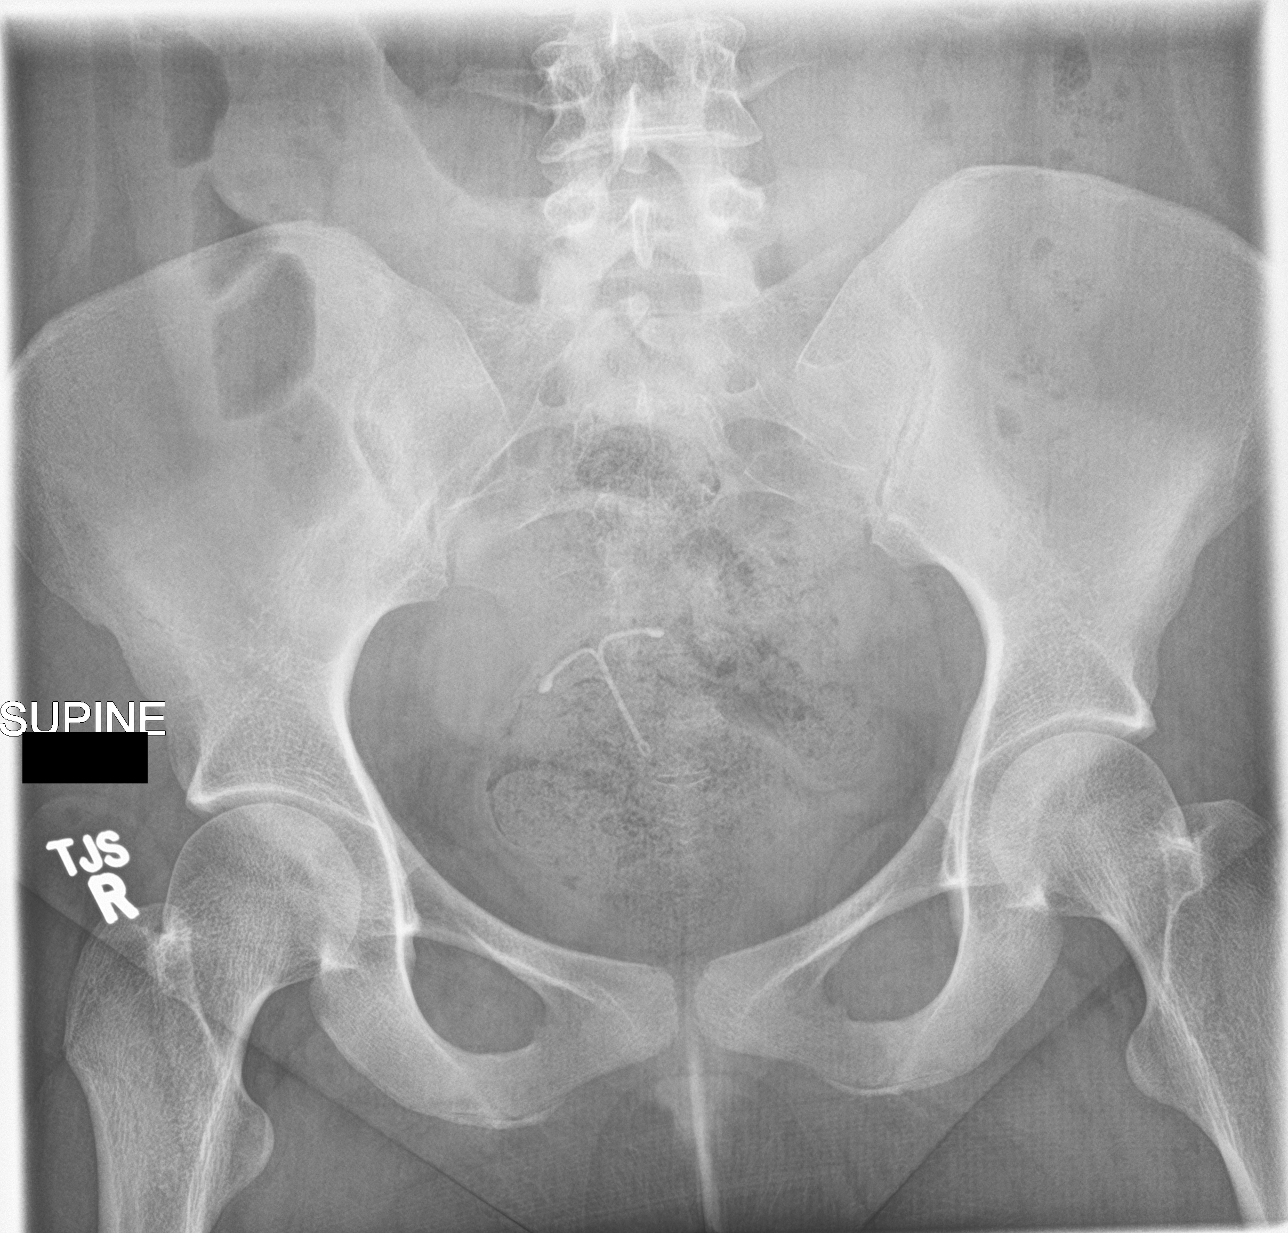

[3 of 3 positions shown; findings below may reference images not displayed]

FINDINGS: IUD noted in the pelvis. Large stool burden in the rectosigmoid
colon. Nonobstructive bowel gas pattern. No organomegaly or free
air. No acute bony abnormality.
IMPRESSION: Large stool burden in the rectosigmoid colon.  No acute findings.

## 2019-04-28 IMAGING — CR DG ANKLE COMPLETE 3+V*R*
1 series · 3 of 3 positions shown · non-contrast
Comparison: 08/05/2013.

CLINICAL DATA: Right ankle injury.

EXAM:
RIGHT ANKLE - COMPLETE 3+ VIEW

[Series 1: dg ankle complete right · 0.14mm/px · 3 of 3 slices shown]
[im 1/3]
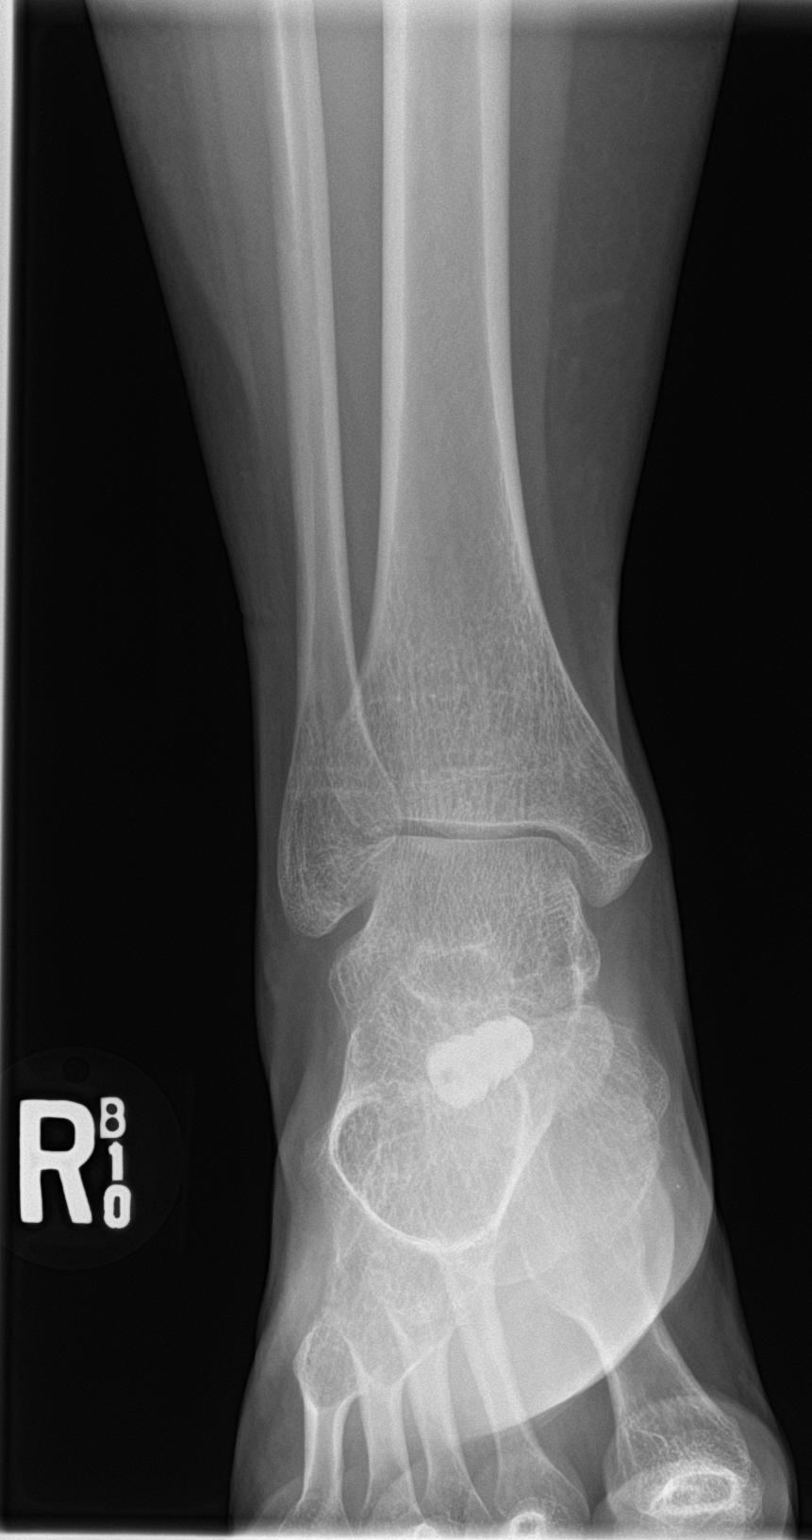
[im 2/3]
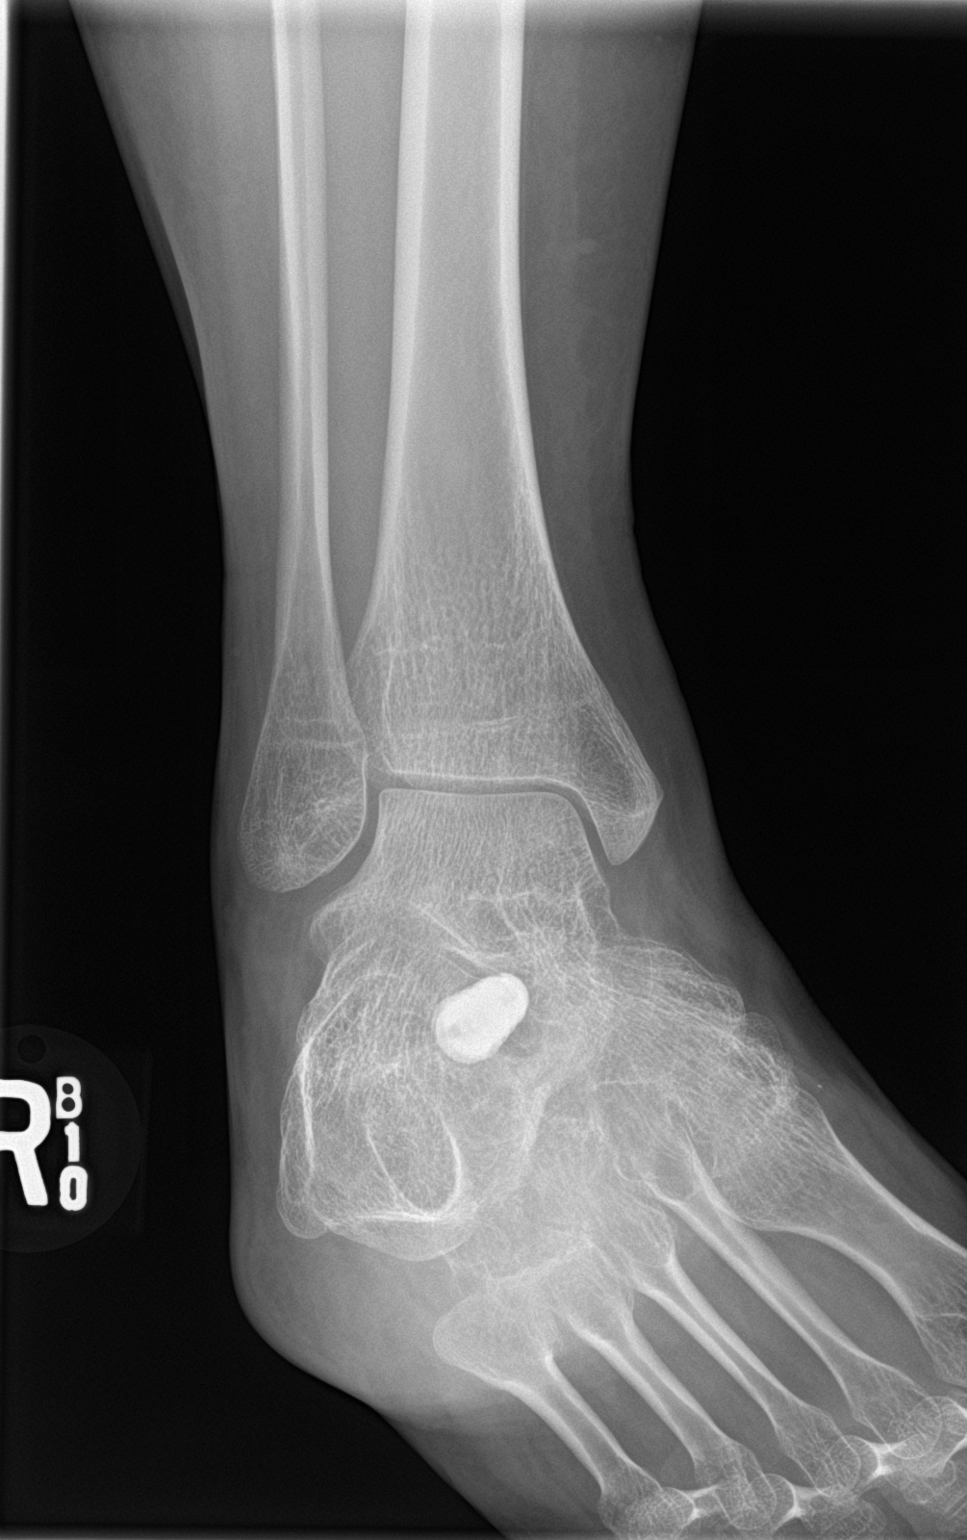
[im 3/3]
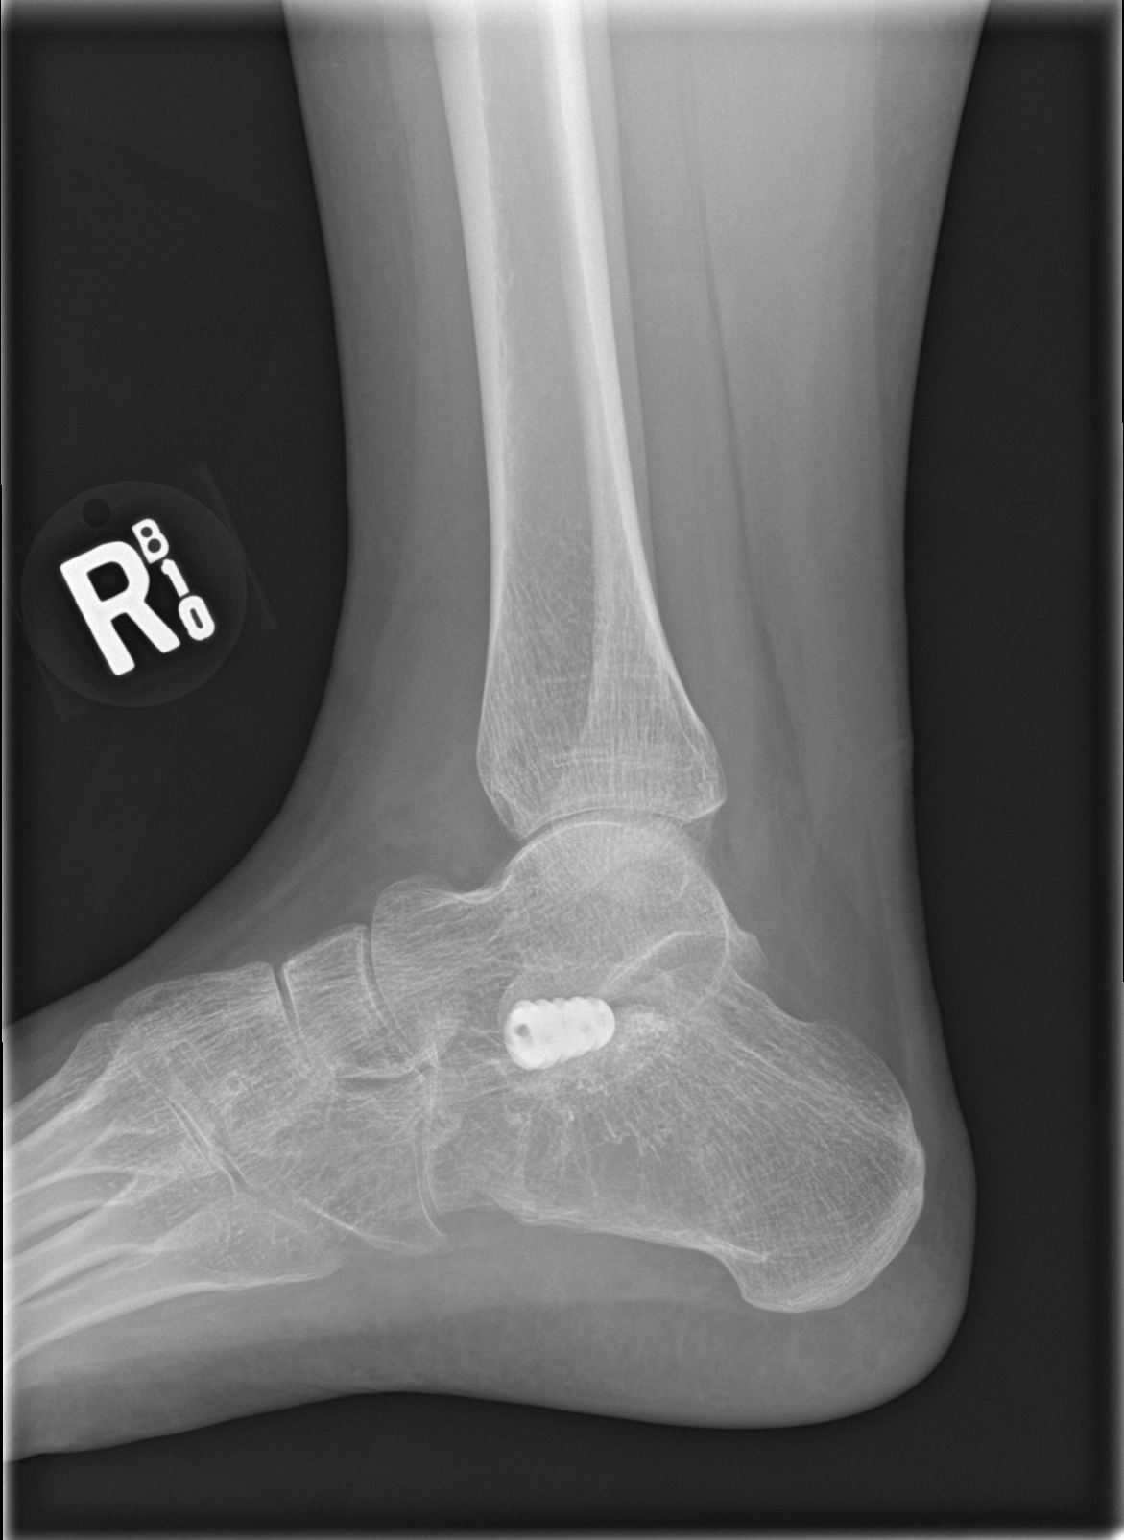

[3 of 3 positions shown; findings below may reference images not displayed]

FINDINGS: No acute bony or joint abnormality. Diffuse osteopenia. Postsurgical
changes appear stable. Hardware intact. No soft tissue foreign
bodies.
IMPRESSION: Stable postsurgical changes. Hardware intact. No acute bony or joint
abnormality. Diffuse osteopenia. No acute bony abnormality.

## 2019-05-05 IMAGING — CR DG ABDOMEN 1V
1 series · 2 of 2 positions shown · non-contrast
Comparison: KUB of 10/02/2017

CLINICAL DATA: Abdominal pain, evaluate stool burden

EXAM:
ABDOMEN - 1 VIEW

[Series 1: dg abd 1 view · 0.14mm/px · 2 of 2 slices shown]
[im 1/2]
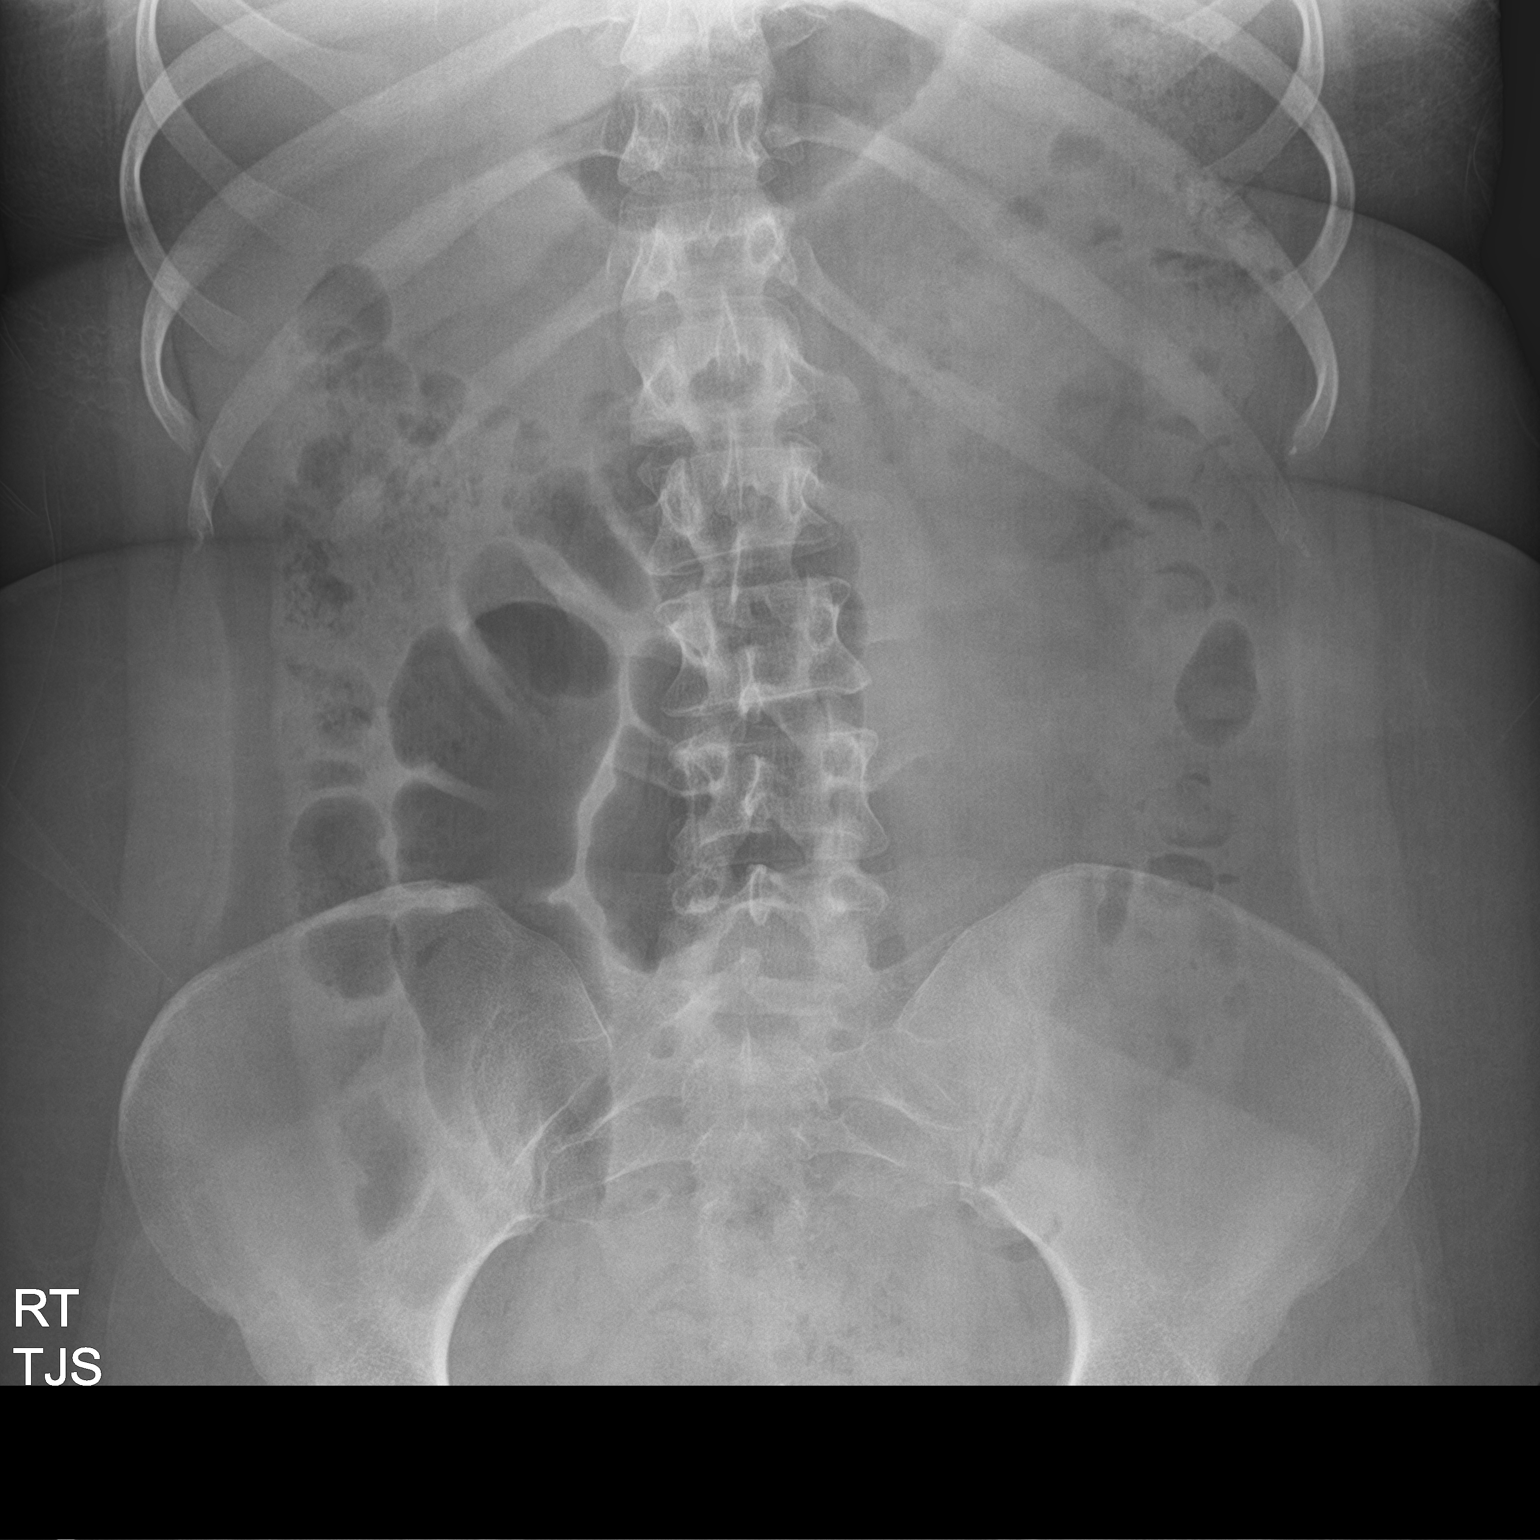
[im 2/2]
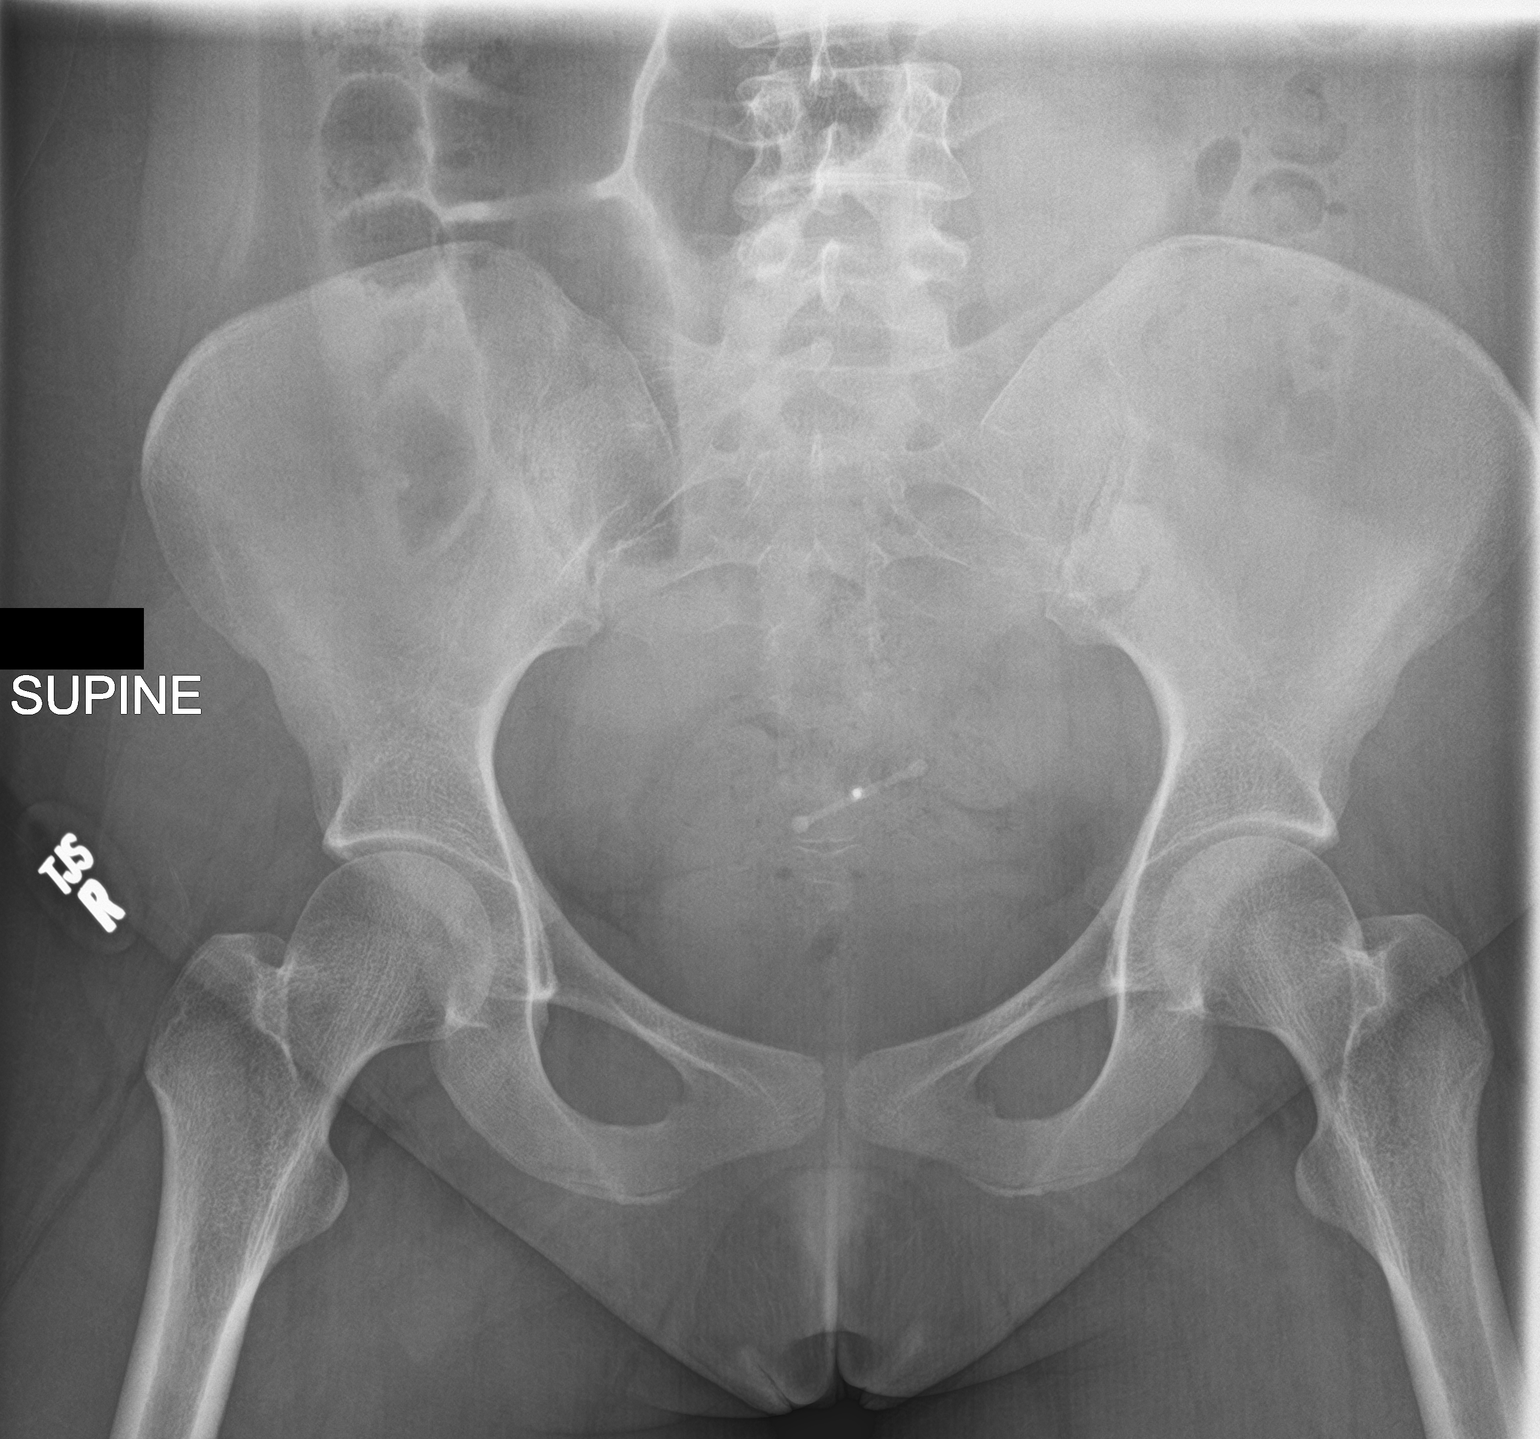

[2 of 2 positions shown; findings below may reference images not displayed]

FINDINGS: There is a moderate amount of feces remaining throughout the entire
colon. No bowel obstruction is seen. IUD is present within the
pelvis. No opaque calculi are seen.
IMPRESSION: Moderate amount of feces remain scattered throughout the entire
colon.

## 2019-05-06 IMAGING — CT CT ABD-PELV W/ CM
2 of 8 series · 15 of 46 positions shown, 17 images · IV contrast (APPLIED)
Comparison: Abdominal radiograph dated 10/09/2017

CLINICAL DATA: 22-year-old female with abdominal pain, nausea
vomiting.

EXAM:
CT ABDOMEN AND PELVIS WITH CONTRAST
TECHNIQUE: Multidetector CT imaging of the abdomen and pelvis was performed
using the standard protocol following bolus administration of
intravenous contrast.
CONTRAST:  100mL OSJ7IN-[id number] IOPAMIDOL (OSJ7IN-0JJ) INJECTION 61%

[Series 2: routine abd/pel with · axial · 0.79mm/px · z∈[+857,+1267]mm · 12 of 96 slices shown, 14 images]
[im 7/96  soft-tissue]
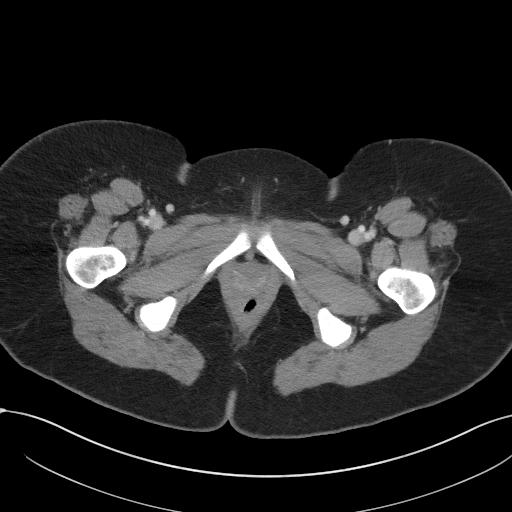
[im 7/96  bone]
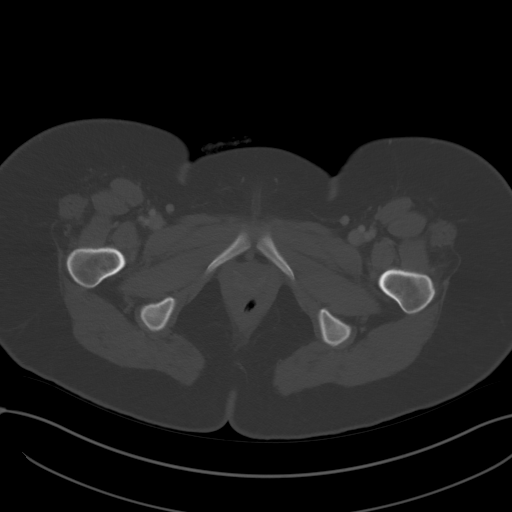
[im 13/96  soft-tissue]
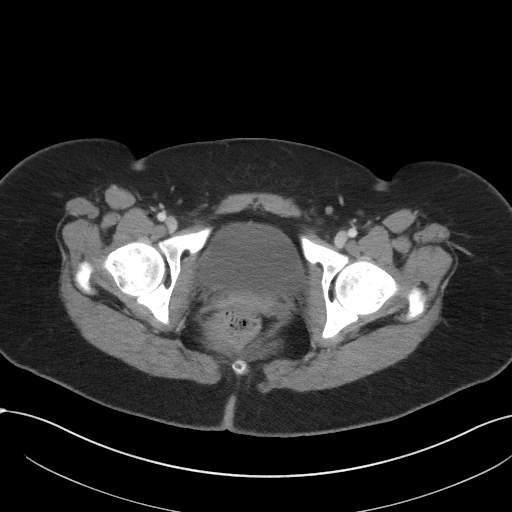
[im 20/96  soft-tissue]
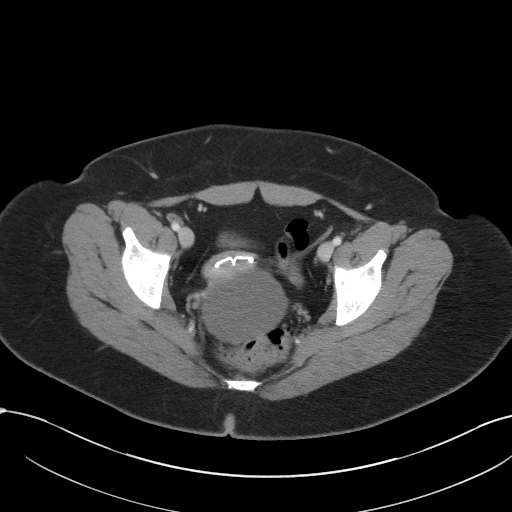
[im 32/96  soft-tissue]
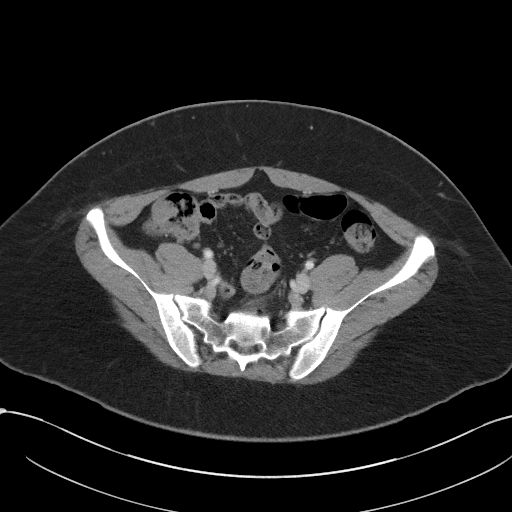
[im 39/96  soft-tissue]
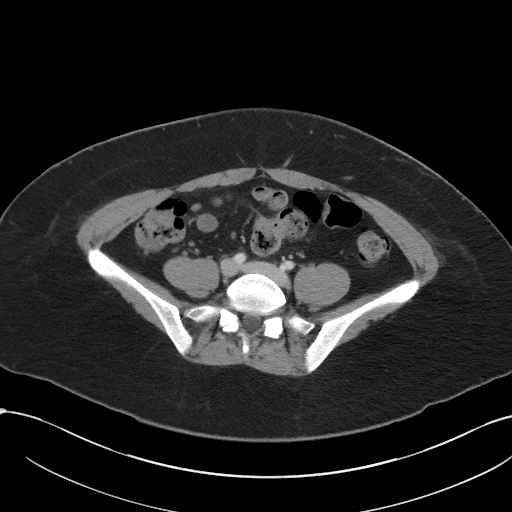
[im 45/96  soft-tissue]
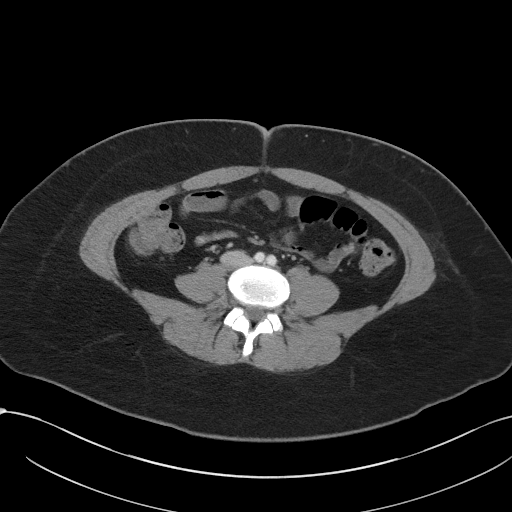
[im 51/96  soft-tissue]
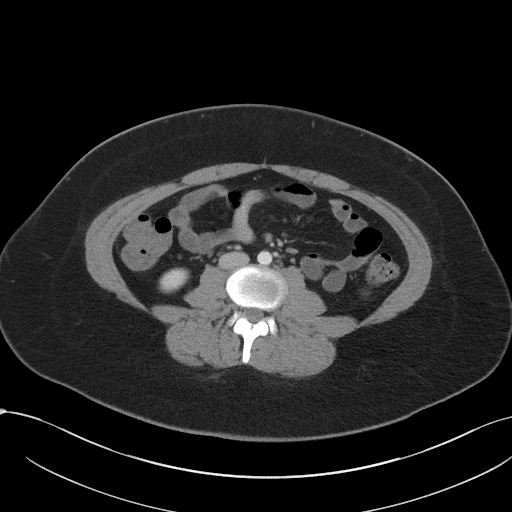
[im 58/96  soft-tissue]
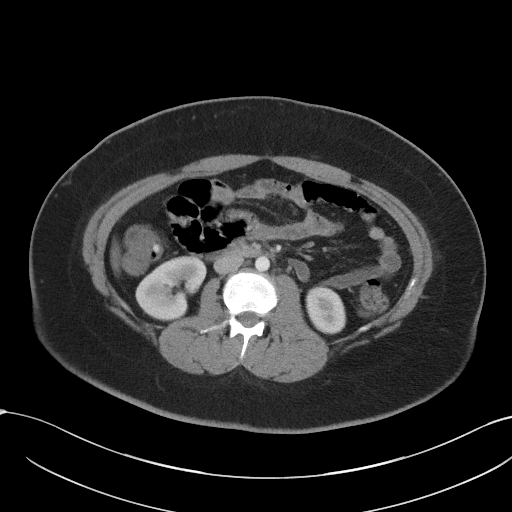
[im 64/96  soft-tissue]
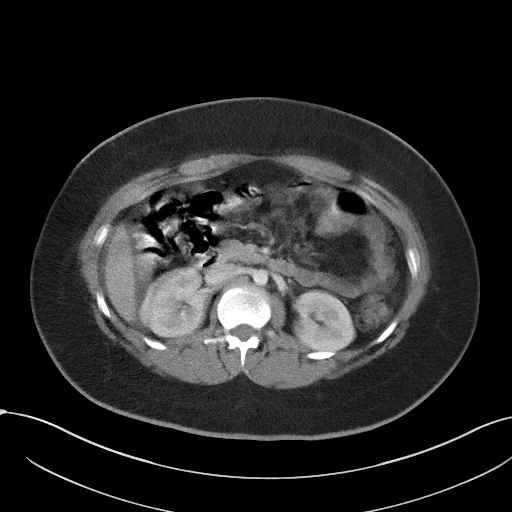
[im 64/96  bone]
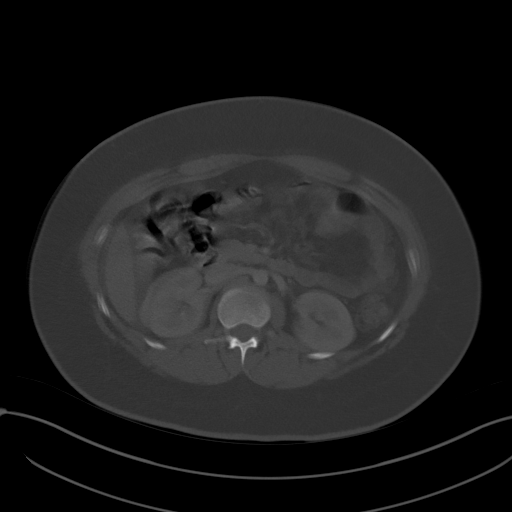
[im 77/96  soft-tissue]
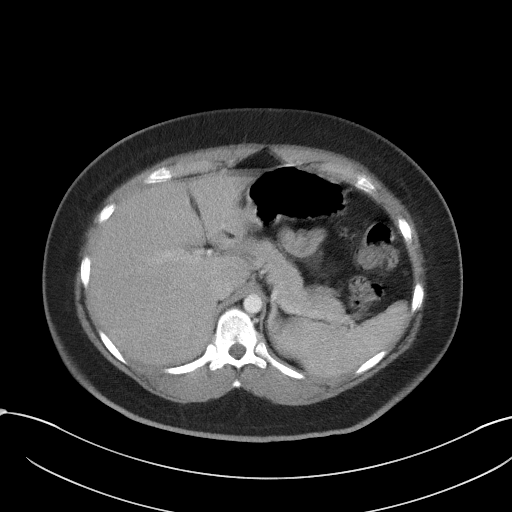
[im 83/96  soft-tissue]
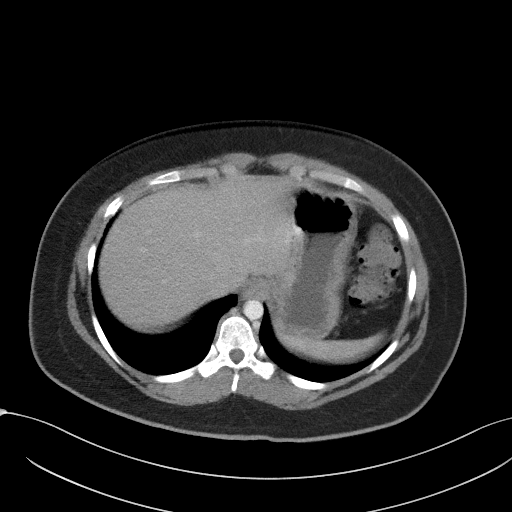
[im 89/96  soft-tissue]
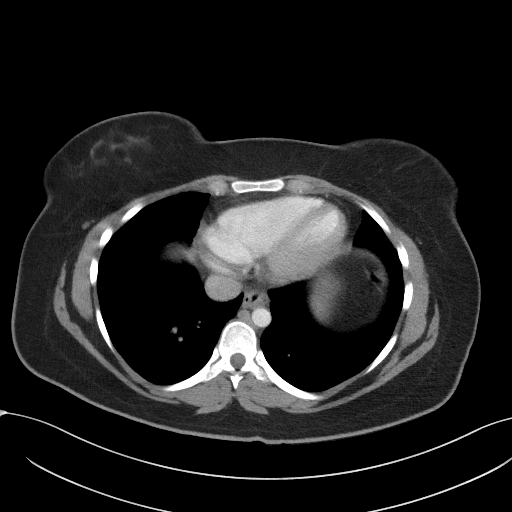

[Series 7: coronal st · coronal · 0.72mm/px · 3 of 79 slices shown]
[im 20/79  soft-tissue]
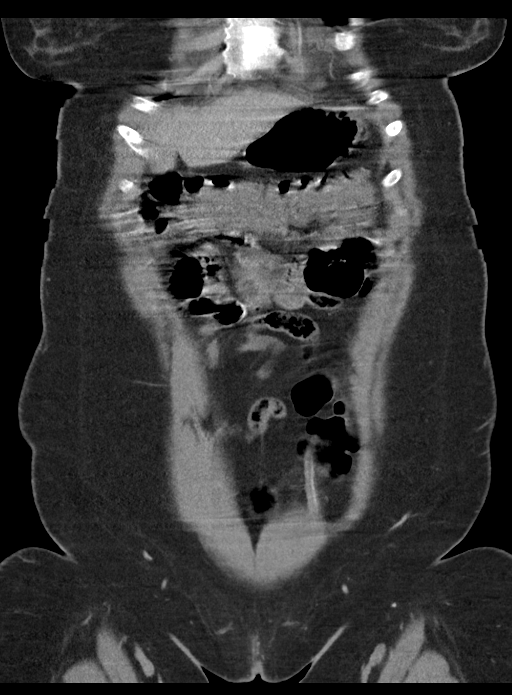
[im 40/79  soft-tissue]
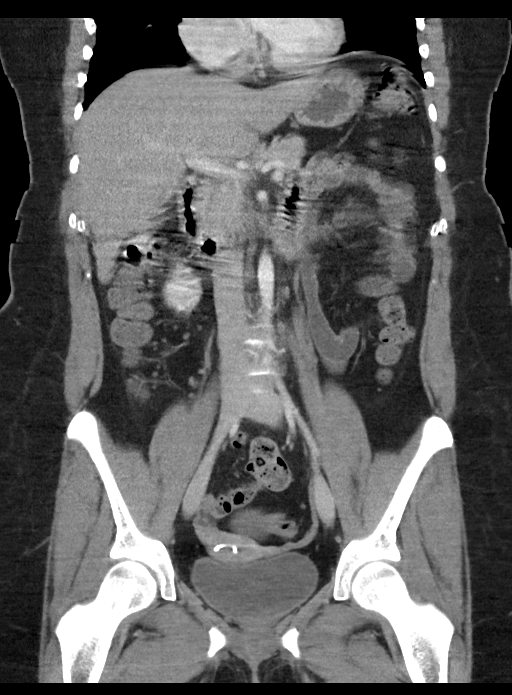
[im 59/79  soft-tissue]
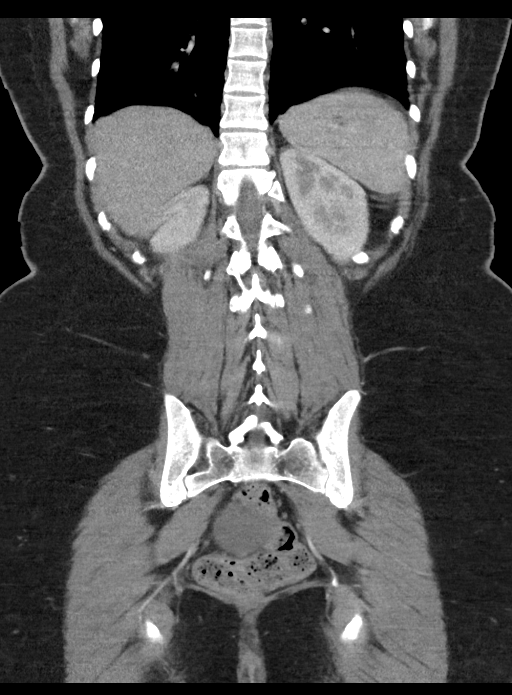

[15 of 46 positions shown; findings below may reference images not displayed]

FINDINGS: Evaluation of this exam is limited due to respiratory motion
artifact.

Lower chest: The visualized lung bases are clear.

No intra-abdominal free air or free fluid.

Hepatobiliary: No focal liver abnormality is seen. No gallstones,
gallbladder wall thickening, or biliary dilatation.

Pancreas: Unremarkable. No pancreatic ductal dilatation or
surrounding inflammatory changes.

Spleen: Normal in size without focal abnormality.

Adrenals/Urinary Tract: Adrenal glands are unremarkable. Kidneys are
normal, without renal calculi, focal lesion, or hydronephrosis.
Bladder is unremarkable.

Stomach/Bowel: Multiple normal caliber fluid-filled loops of small
bowel in the upper abdomen with mild stranding of the mesentery may
represent mild enteritis. Clinical correlation is recommended. There
is no bowel obstruction. The appendix is normal.

Vascular/Lymphatic: No significant vascular findings are present. No
enlarged abdominal or pelvic lymph nodes.

Reproductive: The uterus is anteverted appears unremarkable. An
intrauterine device is noted which appears in appropriate
positioning. There is a 7 cm cyst in the posterior pelvis superior
and posterior to the uterus which appears to arise from the right
ovary. The left ovary is unremarkable.

Other: None

Musculoskeletal: No acute or significant osseous findings.
IMPRESSION: 1. Findings may represent enteritis. Correlation with clinical exam
recommended. No bowel obstruction. Normal appendix.
2. A 7 cm right ovarian cyst. This cyst may be difficult to assess
completely with ultrasound due to large size. Further evaluation
with pelvic MRI on a nonemergent basis recommended.
